# Patient Record
Sex: Female | Born: 1940
Health system: Southern US, Community
[De-identification: ages and names within clinical notes are randomized; demographics above are authoritative.]

## PROBLEM LIST (undated history)

## (undated) DIAGNOSIS — E1142 Type 2 diabetes mellitus with diabetic polyneuropathy: Secondary | ICD-10-CM

## (undated) DIAGNOSIS — M179 Osteoarthritis of knee, unspecified: Secondary | ICD-10-CM

## (undated) DIAGNOSIS — E7800 Pure hypercholesterolemia, unspecified: Secondary | ICD-10-CM

## (undated) DIAGNOSIS — E039 Hypothyroidism, unspecified: Secondary | ICD-10-CM

## (undated) DIAGNOSIS — I10 Essential (primary) hypertension: Secondary | ICD-10-CM

## (undated) DIAGNOSIS — R32 Unspecified urinary incontinence: Secondary | ICD-10-CM

## (undated) DIAGNOSIS — G2581 Restless legs syndrome: Secondary | ICD-10-CM

## (undated) DIAGNOSIS — M1990 Unspecified osteoarthritis, unspecified site: Secondary | ICD-10-CM

## (undated) DIAGNOSIS — G709 Myoneural disorder, unspecified: Secondary | ICD-10-CM

## (undated) DIAGNOSIS — Z9289 Personal history of other medical treatment: Secondary | ICD-10-CM

## (undated) HISTORY — DX: Essential (primary) hypertension: I10

## (undated) HISTORY — PX: APPENDECTOMY: SHX54

## (undated) HISTORY — DX: Unspecified urinary incontinence: R32

## (undated) HISTORY — DX: Pure hypercholesterolemia, unspecified: E78.00

## (undated) HISTORY — PX: OTHER SURGICAL HISTORY: SHX169

## (undated) HISTORY — DX: Restless legs syndrome: G25.81

## (undated) HISTORY — PX: CATARACT EXTRACTION, BILATERAL: SHX1313

## (undated) HISTORY — PX: EYE SURGERY: SHX253

## (undated) HISTORY — DX: Osteoarthritis of knee, unspecified: M17.9

## (undated) HISTORY — PX: KNEE CARTILAGE SURGERY: SHX688

## (undated) HISTORY — PX: TONSILLECTOMY: SUR1361

## (undated) HISTORY — DX: Type 2 diabetes mellitus with diabetic polyneuropathy: E11.42

## (undated) HISTORY — PX: ABDOMINAL HYSTERECTOMY: SHX81

## (undated) HISTORY — DX: Unspecified osteoarthritis, unspecified site: M19.90

---

## 1998-10-27 ENCOUNTER — Encounter: Admission: RE | Admit: 1998-10-27 | Discharge: 1999-01-25 | Payer: Self-pay | Admitting: Internal Medicine

## 1999-07-14 ENCOUNTER — Encounter: Admission: RE | Admit: 1999-07-14 | Discharge: 1999-07-14 | Payer: Self-pay | Admitting: Internal Medicine

## 1999-07-14 ENCOUNTER — Encounter: Payer: Self-pay | Admitting: Internal Medicine

## 1999-07-27 ENCOUNTER — Encounter: Admission: RE | Admit: 1999-07-27 | Discharge: 1999-07-27 | Payer: Self-pay | Admitting: Internal Medicine

## 1999-07-27 ENCOUNTER — Encounter: Payer: Self-pay | Admitting: Internal Medicine

## 1999-10-25 ENCOUNTER — Other Ambulatory Visit: Admission: RE | Admit: 1999-10-25 | Discharge: 1999-10-25 | Payer: Self-pay | Admitting: Internal Medicine

## 2000-01-28 ENCOUNTER — Encounter: Admission: RE | Admit: 2000-01-28 | Discharge: 2000-01-28 | Payer: Self-pay | Admitting: Internal Medicine

## 2000-01-28 ENCOUNTER — Encounter: Payer: Self-pay | Admitting: Internal Medicine

## 2000-08-16 ENCOUNTER — Encounter: Payer: Self-pay | Admitting: Internal Medicine

## 2000-08-16 ENCOUNTER — Encounter: Admission: RE | Admit: 2000-08-16 | Discharge: 2000-08-16 | Payer: Self-pay | Admitting: Internal Medicine

## 2000-12-14 ENCOUNTER — Other Ambulatory Visit: Admission: RE | Admit: 2000-12-14 | Discharge: 2000-12-14 | Payer: Self-pay | Admitting: Internal Medicine

## 2001-08-23 ENCOUNTER — Encounter: Payer: Self-pay | Admitting: Internal Medicine

## 2001-08-23 ENCOUNTER — Encounter: Admission: RE | Admit: 2001-08-23 | Discharge: 2001-08-23 | Payer: Self-pay | Admitting: Internal Medicine

## 2001-12-31 ENCOUNTER — Other Ambulatory Visit: Admission: RE | Admit: 2001-12-31 | Discharge: 2001-12-31 | Payer: Self-pay | Admitting: Internal Medicine

## 2002-09-24 ENCOUNTER — Encounter: Admission: RE | Admit: 2002-09-24 | Discharge: 2002-09-24 | Payer: Self-pay | Admitting: Internal Medicine

## 2002-09-24 ENCOUNTER — Encounter: Payer: Self-pay | Admitting: Internal Medicine

## 2003-09-30 ENCOUNTER — Encounter: Admission: RE | Admit: 2003-09-30 | Discharge: 2003-09-30 | Payer: Self-pay | Admitting: Internal Medicine

## 2004-10-06 ENCOUNTER — Encounter: Admission: RE | Admit: 2004-10-06 | Discharge: 2004-10-06 | Payer: Self-pay | Admitting: Internal Medicine

## 2005-03-08 ENCOUNTER — Other Ambulatory Visit: Admission: RE | Admit: 2005-03-08 | Discharge: 2005-03-08 | Payer: Self-pay | Admitting: Internal Medicine

## 2005-11-09 ENCOUNTER — Encounter: Admission: RE | Admit: 2005-11-09 | Discharge: 2005-11-09 | Payer: Self-pay | Admitting: Internal Medicine

## 2006-12-20 ENCOUNTER — Encounter: Admission: RE | Admit: 2006-12-20 | Discharge: 2006-12-20 | Payer: Self-pay | Admitting: Internal Medicine

## 2007-04-12 ENCOUNTER — Other Ambulatory Visit: Admission: RE | Admit: 2007-04-12 | Discharge: 2007-04-12 | Payer: Self-pay | Admitting: Internal Medicine

## 2008-01-03 ENCOUNTER — Encounter: Admission: RE | Admit: 2008-01-03 | Discharge: 2008-01-03 | Payer: Self-pay | Admitting: Family Medicine

## 2009-01-07 ENCOUNTER — Encounter: Admission: RE | Admit: 2009-01-07 | Discharge: 2009-01-07 | Payer: Self-pay | Admitting: Family Medicine

## 2010-01-12 ENCOUNTER — Encounter: Admission: RE | Admit: 2010-01-12 | Discharge: 2010-01-12 | Payer: Self-pay | Admitting: Family Medicine

## 2010-01-21 ENCOUNTER — Encounter: Admission: RE | Admit: 2010-01-21 | Discharge: 2010-01-21 | Payer: Self-pay | Admitting: Family Medicine

## 2010-06-27 ENCOUNTER — Encounter: Payer: Self-pay | Admitting: Family Medicine

## 2011-01-14 ENCOUNTER — Other Ambulatory Visit: Payer: Self-pay | Admitting: Family Medicine

## 2011-01-14 DIAGNOSIS — Z1231 Encounter for screening mammogram for malignant neoplasm of breast: Secondary | ICD-10-CM

## 2011-02-09 ENCOUNTER — Ambulatory Visit: Payer: Self-pay

## 2011-02-15 ENCOUNTER — Ambulatory Visit
Admission: RE | Admit: 2011-02-15 | Discharge: 2011-02-15 | Disposition: A | Discharge status: Home / Self Care | Payer: Medicare Other | Source: Ambulatory Visit | Attending: Family Medicine | Admitting: Family Medicine

## 2011-02-15 DIAGNOSIS — Z1231 Encounter for screening mammogram for malignant neoplasm of breast: Secondary | ICD-10-CM

## 2011-09-22 ENCOUNTER — Other Ambulatory Visit (HOSPITAL_COMMUNITY): Payer: Self-pay | Admitting: Otolaryngology

## 2011-09-22 DIAGNOSIS — J3489 Other specified disorders of nose and nasal sinuses: Secondary | ICD-10-CM

## 2011-09-27 ENCOUNTER — Ambulatory Visit (HOSPITAL_COMMUNITY)
Admission: RE | Admit: 2011-09-27 | Discharge: 2011-09-27 | Disposition: A | Discharge status: Home / Self Care | Payer: Medicare Other | Source: Ambulatory Visit | Attending: Otolaryngology | Admitting: Otolaryngology

## 2011-09-27 DIAGNOSIS — R221 Localized swelling, mass and lump, neck: Secondary | ICD-10-CM | POA: Insufficient documentation

## 2011-09-27 DIAGNOSIS — J3489 Other specified disorders of nose and nasal sinuses: Secondary | ICD-10-CM

## 2011-09-27 DIAGNOSIS — R220 Localized swelling, mass and lump, head: Secondary | ICD-10-CM | POA: Insufficient documentation

## 2011-12-14 ENCOUNTER — Other Ambulatory Visit (HOSPITAL_COMMUNITY): Payer: Self-pay | Admitting: Otolaryngology

## 2012-01-16 ENCOUNTER — Encounter (HOSPITAL_COMMUNITY): Payer: Self-pay | Admitting: Pharmacy Technician

## 2012-01-27 ENCOUNTER — Encounter (HOSPITAL_COMMUNITY): Payer: Self-pay | Admitting: Pharmacy Technician

## 2012-01-27 ENCOUNTER — Encounter (HOSPITAL_COMMUNITY)
Admission: RE | Admit: 2012-01-27 | Discharge: 2012-01-27 | Disposition: A | Discharge status: Home / Self Care | Payer: Medicare Other | Source: Ambulatory Visit | Attending: Otolaryngology | Admitting: Otolaryngology

## 2012-01-27 ENCOUNTER — Encounter (HOSPITAL_COMMUNITY): Payer: Self-pay

## 2012-01-27 HISTORY — DX: Hypothyroidism, unspecified: E03.9

## 2012-01-27 HISTORY — DX: Myoneural disorder, unspecified: G70.9

## 2012-01-27 HISTORY — DX: Personal history of other medical treatment: Z92.89

## 2012-01-27 HISTORY — DX: Unspecified osteoarthritis, unspecified site: M19.90

## 2012-01-27 HISTORY — DX: Essential (primary) hypertension: I10

## 2012-01-27 LAB — CBC
HCT: 45.8 % (ref 36.0–46.0)
Hemoglobin: 15.7 g/dL — ABNORMAL HIGH (ref 12.0–15.0)
MCH: 32.3 pg (ref 26.0–34.0)
MCHC: 34.3 g/dL (ref 30.0–36.0)
MCV: 94.2 fL (ref 78.0–100.0)
Platelets: 254 10*3/uL (ref 150–400)
RBC: 4.86 MIL/uL (ref 3.87–5.11)
RDW: 11.9 % (ref 11.5–15.5)
WBC: 8.1 10*3/uL (ref 4.0–10.5)

## 2012-01-27 LAB — BASIC METABOLIC PANEL
BUN: 19 mg/dL (ref 6–23)
CO2: 33 mEq/L — ABNORMAL HIGH (ref 19–32)
Calcium: 10.9 mg/dL — ABNORMAL HIGH (ref 8.4–10.5)
Chloride: 99 mEq/L (ref 96–112)
Creatinine, Ser: 0.7 mg/dL (ref 0.50–1.10)
GFR calc Af Amer: >90 mL/min (ref >90)
GFR calc non Af Amer: 86 mL/min — ABNORMAL LOW (ref >90)
Glucose, Bld: 151 mg/dL — ABNORMAL HIGH (ref 70–99)
Potassium: 3.9 mEq/L (ref 3.5–5.1)
Sodium: 142 mEq/L (ref 135–145)

## 2012-01-27 LAB — SURGICAL PCR SCREEN
MRSA, PCR: NEGATIVE
Staphylococcus aureus: NEGATIVE

## 2012-01-27 NOTE — Progress Notes (Signed)
Left voicemail at MR at [facility] location-ph: [phone], requesting last ekg.

## 2012-01-27 NOTE — Pre-Procedure Instructions (Signed)
20 Brittney Malone  01/27/2012   Your procedure is scheduled on:  August 29th  Report to [facility] at 5:30 AM.  Call this number if you have problems the morning of surgery: [phone]   Remember:   Do not eat food or drink :After Midnight.       Take these medicines the morning of surgery with A SIP OF WATER: None  Do not wear jewelry, make-up or nail polish.  Do not wear lotions, powders, or perfumes. You may wear deodorant.  Do not shave 48 hours prior to surgery. Men may shave face and neck.  Do not bring valuables to the hospital.  Contacts, dentures or bridgework may not be worn into surgery.  Leave suitcase in the car. After surgery it may be brought to your room.  For patients admitted to the hospital, checkout time is 11:00 AM the day of discharge.   Patients discharged the day of surgery will not be allowed to drive home.    Special Instructions: CHG Shower Use Special Wash: 1/2 bottle night before surgery and 1/2 bottle morning of surgery.   Please read over the following fact sheets that you were given: Pain Booklet, Coughing and Deep Breathing, MRSA Information and Surgical Site Infection Prevention

## 2012-01-27 NOTE — Progress Notes (Signed)
Pt. Reports that she had her last EKG in PCP office, Dr. Dahlia Bailiff at [place] location .

## 2012-01-31 NOTE — Consult Note (Signed)
Anesthesia chart review: Patient is a 71 year old female scheduled for endoscopic resection of left anterior cranial fossa tumor, left maxillary antrostomy, left ethmoidectomy, left sphenoidectomy, left frontal sinusotomy for hemangiopericytoma by Dr. Emeline Darling on 02/02/12.  History includes non-smoker, obesity, HTN, hypothyroidism, DM2, arthritis.  PCP is Dr. Tiburcio Pea ([facility]).    Labs noted.  Cr 0.70, glucose 151.  H/H 15.7/45.8.    CXR on 01/27/12 showed minimal bronchitic changes.  EKG on 01/27/12 showed SR with PVC/PAC, poor r wave progression (cannot exclude prior anterior MI).  She had a previous EKG at [facility] on 02/17/09.  Low r wave in V3 and single PVC/PAC are new, otherwise stable.  She reportedly had a normal stress > 3 years ago (no copy found at [facility]). No CV symptoms were documented at her PAT appointment.  Clinical correlation on the day of surgery.  If asymptomatic, then anticipate she can proceed as planned.  Shonna Chock, PA-C

## 2012-02-02 ENCOUNTER — Encounter (HOSPITAL_COMMUNITY)
Admission: RE | Disposition: A | Discharge status: Home / Self Care | Payer: Self-pay | Source: Ambulatory Visit | Attending: Otolaryngology

## 2012-02-02 ENCOUNTER — Encounter (HOSPITAL_COMMUNITY): Payer: Self-pay | Admitting: *Deleted

## 2012-02-02 ENCOUNTER — Encounter (HOSPITAL_COMMUNITY): Payer: Self-pay | Admitting: Vascular Surgery

## 2012-02-02 ENCOUNTER — Observation Stay (HOSPITAL_COMMUNITY)
Admission: RE | Admit: 2012-02-02 | Discharge: 2012-02-03 | Disposition: A | Discharge status: Home / Self Care | Payer: Medicare Other | Source: Ambulatory Visit | Attending: Otolaryngology | Admitting: Otolaryngology

## 2012-02-02 ENCOUNTER — Ambulatory Visit (HOSPITAL_COMMUNITY): Payer: Medicare Other | Admitting: Vascular Surgery

## 2012-02-02 DIAGNOSIS — E119 Type 2 diabetes mellitus without complications: Secondary | ICD-10-CM | POA: Insufficient documentation

## 2012-02-02 DIAGNOSIS — I10 Essential (primary) hypertension: Secondary | ICD-10-CM | POA: Insufficient documentation

## 2012-02-02 DIAGNOSIS — Z01810 Encounter for preprocedural cardiovascular examination: Secondary | ICD-10-CM | POA: Insufficient documentation

## 2012-02-02 DIAGNOSIS — C311 Malignant neoplasm of ethmoidal sinus: Principal | ICD-10-CM | POA: Insufficient documentation

## 2012-02-02 DIAGNOSIS — E039 Hypothyroidism, unspecified: Secondary | ICD-10-CM | POA: Insufficient documentation

## 2012-02-02 DIAGNOSIS — Z01812 Encounter for preprocedural laboratory examination: Secondary | ICD-10-CM | POA: Insufficient documentation

## 2012-02-02 DIAGNOSIS — G988 Other disorders of nervous system: Secondary | ICD-10-CM | POA: Insufficient documentation

## 2012-02-02 DIAGNOSIS — Z01818 Encounter for other preprocedural examination: Secondary | ICD-10-CM | POA: Insufficient documentation

## 2012-02-02 DIAGNOSIS — Y838 Other surgical procedures as the cause of abnormal reaction of the patient, or of later complication, without mention of misadventure at the time of the procedure: Secondary | ICD-10-CM | POA: Insufficient documentation

## 2012-02-02 HISTORY — PX: NASAL SINUS SURGERY: SHX719

## 2012-02-02 HISTORY — PX: ETHMOIDECTOMY: SHX5197

## 2012-02-02 HISTORY — PX: MAXILLARY ANTROSTOMY: SHX2003

## 2012-02-02 HISTORY — PX: SPHENOIDECTOMY: SHX2421

## 2012-02-02 LAB — GLUCOSE, CAPILLARY
Glucose-Capillary: 139 mg/dL — ABNORMAL HIGH (ref 70–99)
Glucose-Capillary: 158 mg/dL — ABNORMAL HIGH (ref 70–99)
Glucose-Capillary: 183 mg/dL — ABNORMAL HIGH (ref 70–99)
Glucose-Capillary: 178 mg/dL — ABNORMAL HIGH (ref 70–99)

## 2012-02-02 SURGERY — MAXILLARY ANTROSTOMY
Anesthesia: General | Site: Nose | Laterality: Left | Wound class: Clean Contaminated

## 2012-02-02 MED ORDER — HYDROMORPHONE HCL PF 1 MG/ML IJ SOLN
0.2500 mg | INTRAMUSCULAR | Status: DC | PRN
  Administered 2012-02-02: 0.5 mg via INTRAVENOUS

## 2012-02-02 MED ORDER — OXYCODONE HCL 5 MG PO TABS
5.0000 mg | ORAL_TABLET | Freq: Once | ORAL | Status: DC | PRN
Start: 2012-02-02 — End: 2012-02-02

## 2012-02-02 MED ORDER — PROPOFOL 10 MG/ML IV EMUL
INTRAVENOUS | Status: DC | PRN
  Administered 2012-02-02: 150 mg via INTRAVENOUS
  Administered 2012-02-02: 50 mg via INTRAVENOUS

## 2012-02-02 MED ORDER — HYDROMORPHONE HCL PF 1 MG/ML IJ SOLN
INTRAMUSCULAR | Status: AC
  Filled 2012-02-02: qty 1

## 2012-02-02 MED ORDER — HEMOSTATIC AGENTS (NO CHARGE) OPTIME
TOPICAL | Status: DC | PRN
  Administered 2012-02-02: 1 via TOPICAL

## 2012-02-02 MED ORDER — ADULT MULTIVITAMIN W/MINERALS CH
1.0000 | ORAL_TABLET | Freq: Every day | ORAL | Status: DC
Start: 2012-02-02 — End: 2012-02-03
  Administered 2012-02-02 – 2012-02-03 (×2): 1 via ORAL
  Filled 2012-02-02 (×2): qty 1

## 2012-02-02 MED ORDER — INSULIN ASPART 100 UNIT/ML ~~LOC~~ SOLN: 0.0000 [IU] | Freq: Every day | SUBCUTANEOUS | Status: DC

## 2012-02-02 MED ORDER — SODIUM CHLORIDE 0.9 % IR SOLN
Status: DC | PRN
  Administered 2012-02-02: 1000 mL

## 2012-02-02 MED ORDER — HYDROCODONE-ACETAMINOPHEN 5-325 MG PO TABS
1.0000 | ORAL_TABLET | ORAL | Status: DC | PRN
  Administered 2012-02-02: 1 via ORAL
  Administered 2012-02-03 (×2): 2 via ORAL
  Filled 2012-02-02 (×3): qty 2

## 2012-02-02 MED ORDER — ONDANSETRON HCL 4 MG/2ML IJ SOLN: 4.0000 mg | Freq: Four times a day (QID) | INTRAMUSCULAR | Status: DC | PRN

## 2012-02-02 MED ORDER — GLIPIZIDE-METFORMIN HCL 5-500 MG PO TABS: 2.0000 | ORAL_TABLET | Freq: Two times a day (BID) | ORAL | Status: DC

## 2012-02-02 MED ORDER — MIDAZOLAM HCL 5 MG/5ML IJ SOLN
INTRAMUSCULAR | Status: DC | PRN
  Administered 2012-02-02: 2 mg via INTRAVENOUS

## 2012-02-02 MED ORDER — DICLOFENAC SODIUM 1.5 % TD SOLN: 40.0000 [drp] | Freq: Two times a day (BID) | TRANSDERMAL | Status: DC

## 2012-02-02 MED ORDER — ARTIFICIAL TEARS OP OINT
TOPICAL_OINTMENT | OPHTHALMIC | Status: DC | PRN
  Administered 2012-02-02: 1 via OPHTHALMIC

## 2012-02-02 MED ORDER — PHENOL 1.4 % MT LIQD
2.0000 | Freq: Three times a day (TID) | OROMUCOSAL | Status: DC | PRN
  Filled 2012-02-02: qty 177

## 2012-02-02 MED ORDER — PHENYLEPHRINE HCL 10 MG/ML IJ SOLN
10.0000 mg | INTRAVENOUS | Status: DC | PRN
  Administered 2012-02-02: 20 ug/min via INTRAVENOUS

## 2012-02-02 MED ORDER — PHENYLEPHRINE HCL 10 MG/ML IJ SOLN
INTRAMUSCULAR | Status: DC | PRN
  Administered 2012-02-02 (×5): 80 ug via INTRAVENOUS

## 2012-02-02 MED ORDER — HYDROCHLOROTHIAZIDE 25 MG PO TABS
25.0000 mg | ORAL_TABLET | Freq: Every day | ORAL | Status: DC
  Administered 2012-02-03: 25 mg via ORAL
  Filled 2012-02-02 (×2): qty 1

## 2012-02-02 MED ORDER — DROPERIDOL 2.5 MG/ML IJ SOLN: 0.6250 mg | INTRAMUSCULAR | Status: DC | PRN

## 2012-02-02 MED ORDER — LEVOTHYROXINE SODIUM 150 MCG PO TABS
150.0000 ug | ORAL_TABLET | Freq: Every day | ORAL | Status: DC
  Administered 2012-02-03: 150 ug via ORAL
  Filled 2012-02-02 (×2): qty 1

## 2012-02-02 MED ORDER — CEFAZOLIN SODIUM-DEXTROSE 2-3 GM-% IV SOLR
2.0000 g | Freq: Once | INTRAVENOUS | Status: AC
  Administered 2012-02-02: 2 g via INTRAVENOUS
  Filled 2012-02-02: qty 50

## 2012-02-02 MED ORDER — ACETAMINOPHEN 80 MG PO CHEW
320.0000 mg | CHEWABLE_TABLET | ORAL | Status: DC | PRN
  Filled 2012-02-02: qty 4

## 2012-02-02 MED ORDER — METFORMIN HCL 500 MG PO TABS
1000.0000 mg | ORAL_TABLET | Freq: Two times a day (BID) | ORAL | Status: DC
  Administered 2012-02-02 – 2012-02-03 (×2): 1000 mg via ORAL
  Filled 2012-02-02 (×4): qty 2

## 2012-02-02 MED ORDER — CALCIUM CARBONATE-VITAMIN D 600-400 MG-UNIT PO CHEW: 1.0000 | CHEWABLE_TABLET | Freq: Every day | ORAL | Status: DC

## 2012-02-02 MED ORDER — CALCIUM CARBONATE-VITAMIN D 500-200 MG-UNIT PO TABS
1.0000 | ORAL_TABLET | Freq: Every day | ORAL | Status: DC
  Administered 2012-02-03: 1 via ORAL
  Filled 2012-02-02 (×2): qty 1

## 2012-02-02 MED ORDER — BACITRACIN-NEOMYCIN-POLYMYXIN 400-5-5000 EX OINT
TOPICAL_OINTMENT | CUTANEOUS | Status: AC
  Filled 2012-02-02: qty 1

## 2012-02-02 MED ORDER — CEFAZOLIN SODIUM 1-5 GM-% IV SOLN
1.0000 g | Freq: Three times a day (TID) | INTRAVENOUS | Status: DC
  Administered 2012-02-02 – 2012-02-03 (×3): 1 g via INTRAVENOUS
  Filled 2012-02-02 (×5): qty 50

## 2012-02-02 MED ORDER — FENTANYL CITRATE 0.05 MG/ML IJ SOLN
INTRAMUSCULAR | Status: DC | PRN
  Administered 2012-02-02 (×2): 50 ug via INTRAVENOUS
  Administered 2012-02-02: 150 ug via INTRAVENOUS

## 2012-02-02 MED ORDER — OMEGA-3-ACID ETHYL ESTERS 1 G PO CAPS
1.0000 g | ORAL_CAPSULE | Freq: Every day | ORAL | Status: DC
  Administered 2012-02-03: 1 g via ORAL
  Filled 2012-02-02 (×2): qty 1

## 2012-02-02 MED ORDER — ONDANSETRON HCL 4 MG PO TABS: 4.0000 mg | ORAL_TABLET | Freq: Four times a day (QID) | ORAL | Status: DC | PRN

## 2012-02-02 MED ORDER — POTASSIUM CHLORIDE ER 10 MEQ PO TBCR
20.0000 meq | EXTENDED_RELEASE_TABLET | Freq: Every day | ORAL | Status: DC
  Administered 2012-02-03: 20 meq via ORAL
  Filled 2012-02-02 (×2): qty 2

## 2012-02-02 MED ORDER — MUPIROCIN CALCIUM 2 % NA OINT
TOPICAL_OINTMENT | NASAL | Status: DC | PRN
  Administered 2012-02-02: 1 via NASAL

## 2012-02-02 MED ORDER — MUPIROCIN CALCIUM 2 % EX CREA
TOPICAL_CREAM | CUTANEOUS | Status: AC
  Filled 2012-02-02: qty 15

## 2012-02-02 MED ORDER — MORPHINE SULFATE 2 MG/ML IJ SOLN
1.0000 mg | INTRAMUSCULAR | Status: DC | PRN
  Administered 2012-02-02: 14:00:00 via INTRAVENOUS
  Administered 2012-02-03: 1 mg via INTRAVENOUS
  Filled 2012-02-02 (×2): qty 1

## 2012-02-02 MED ORDER — LIDOCAINE HCL 4 % MT SOLN
OROMUCOSAL | Status: DC | PRN
  Administered 2012-02-02: 4 mL via TOPICAL

## 2012-02-02 MED ORDER — OXYCODONE HCL 5 MG/5ML PO SOLN: 5.0000 mg | Freq: Once | ORAL | Status: DC | PRN

## 2012-02-02 MED ORDER — LIDOCAINE-EPINEPHRINE 1 %-1:100000 IJ SOLN
INTRAMUSCULAR | Status: AC
  Filled 2012-02-02: qty 1

## 2012-02-02 MED ORDER — CEFAZOLIN SODIUM-DEXTROSE 2-3 GM-% IV SOLR
INTRAVENOUS | Status: AC
  Filled 2012-02-02: qty 50

## 2012-02-02 MED ORDER — NAPROXEN SODIUM 220 MG PO TABS: 220.0000 mg | ORAL_TABLET | Freq: Every day | ORAL | Status: DC

## 2012-02-02 MED ORDER — 0.9 % SODIUM CHLORIDE (POUR BTL) OPTIME
TOPICAL | Status: DC | PRN
  Administered 2012-02-02: 1000 mL

## 2012-02-02 MED ORDER — OXYMETAZOLINE HCL 0.05 % NA SOLN
NASAL | Status: DC | PRN
  Administered 2012-02-02: 1

## 2012-02-02 MED ORDER — LIDOCAINE-EPINEPHRINE 1 %-1:100000 IJ SOLN
INTRAMUSCULAR | Status: DC | PRN
  Administered 2012-02-02: 6 mL

## 2012-02-02 MED ORDER — VITAMIN D3 25 MCG (1000 UNIT) PO TABS
1000.0000 [IU] | ORAL_TABLET | Freq: Every day | ORAL | Status: DC
  Administered 2012-02-02 – 2012-02-03 (×2): 1000 [IU] via ORAL
  Filled 2012-02-02 (×2): qty 1

## 2012-02-02 MED ORDER — LIDOCAINE HCL 1 % IJ SOLN
INTRAMUSCULAR | Status: DC | PRN
  Administered 2012-02-02: 90 mg via INTRADERMAL

## 2012-02-02 MED ORDER — GLIPIZIDE 10 MG PO TABS
10.0000 mg | ORAL_TABLET | Freq: Two times a day (BID) | ORAL | Status: DC
  Administered 2012-02-02 – 2012-02-03 (×2): 10 mg via ORAL
  Filled 2012-02-02 (×4): qty 1

## 2012-02-02 MED ORDER — LACTATED RINGERS IV SOLN
INTRAVENOUS | Status: DC | PRN
  Administered 2012-02-02 (×2): via INTRAVENOUS

## 2012-02-02 MED ORDER — ONDANSETRON HCL 4 MG/2ML IJ SOLN
INTRAMUSCULAR | Status: DC | PRN
  Administered 2012-02-02: 4 mg via INTRAVENOUS

## 2012-02-02 MED ORDER — HYDRALAZINE HCL 20 MG/ML IJ SOLN
10.0000 mg | Freq: Three times a day (TID) | INTRAMUSCULAR | Status: DC | PRN
  Filled 2012-02-02: qty 0.5

## 2012-02-02 MED ORDER — SUCCINYLCHOLINE CHLORIDE 20 MG/ML IJ SOLN
INTRAMUSCULAR | Status: DC | PRN
  Administered 2012-02-02: 120 mg via INTRAVENOUS

## 2012-02-02 MED ORDER — OXYMETAZOLINE HCL 0.05 % NA SOLN
NASAL | Status: AC
  Filled 2012-02-02: qty 15

## 2012-02-02 MED ORDER — GLUCOSAMINE-CHONDROITIN 250-200 MG PO TABS: 1.0000 | ORAL_TABLET | Freq: Two times a day (BID) | ORAL | Status: DC

## 2012-02-02 MED ORDER — NAPROXEN 250 MG PO TABS
250.0000 mg | ORAL_TABLET | Freq: Every day | ORAL | Status: DC
  Administered 2012-02-03: 250 mg via ORAL
  Filled 2012-02-02: qty 1

## 2012-02-02 MED ORDER — KCL IN DEXTROSE-NACL 10-5-0.45 MEQ/L-%-% IV SOLN
INTRAVENOUS | Status: DC
  Administered 2012-02-02: 18:00:00 via INTRAVENOUS
  Filled 2012-02-02 (×3): qty 1000

## 2012-02-02 MED ORDER — INSULIN ASPART 100 UNIT/ML ~~LOC~~ SOLN
0.0000 [IU] | Freq: Three times a day (TID) | SUBCUTANEOUS | Status: DC
  Administered 2012-02-03: 3 [IU] via SUBCUTANEOUS

## 2012-02-02 SURGICAL SUPPLY — 64 items
APL SKNCLS STERI-STRIP NONHPOA (GAUZE/BANDAGES/DRESSINGS) ×1
ATTRACTOMAT 16X20 MAGNETIC DRP (DRAPES) IMPLANT
BENZOIN TINCTURE PRP APPL 2/3 (GAUZE/BANDAGES/DRESSINGS) ×1 IMPLANT
BLADE RAD40 ROTATE 4M 4 5PK (BLADE) IMPLANT
BLADE RAD60 ROTATE M4 4 5PK (BLADE) ×1 IMPLANT
BLADE TRICUT ROTATE M4 4 5PK (BLADE) ×2 IMPLANT
CANISTER SUCTION 2500CC (MISCELLANEOUS) ×3 IMPLANT
CLOTH BEACON ORANGE TIMEOUT ST (SAFETY) ×2 IMPLANT
COAGULATOR SUCT SWTCH 10FR 6 (ELECTROSURGICAL) ×2 IMPLANT
CONT SPEC 4OZ CLIKSEAL STRL BL (MISCELLANEOUS) ×6 IMPLANT
CORDS BIPOLAR (ELECTRODE) ×1 IMPLANT
CRADLE DONUT ADULT HEAD (MISCELLANEOUS) ×1 IMPLANT
DECANTER SPIKE VIAL GLASS SM (MISCELLANEOUS) ×2 IMPLANT
DRESSING NASAL POPE 10X1.5X2.5 (GAUZE/BANDAGES/DRESSINGS) IMPLANT
DRESSING TELFA 8X3 (GAUZE/BANDAGES/DRESSINGS) IMPLANT
DRSG NASAL POPE 10X1.5X2.5 (GAUZE/BANDAGES/DRESSINGS) ×2
DRSG NASOPORE 8CM (GAUZE/BANDAGES/DRESSINGS) IMPLANT
DURASEAL SPINE SEALANT 3ML (MISCELLANEOUS) ×1 IMPLANT
ELECT REM PT RETURN 9FT ADLT (ELECTROSURGICAL) ×2
ELECTRODE REM PT RTRN 9FT ADLT (ELECTROSURGICAL) IMPLANT
FILTER ARTHROSCOPY CONVERTOR (FILTER) ×2 IMPLANT
FLOSEAL 10ML (HEMOSTASIS) IMPLANT
GLOVE BIOGEL PI IND STRL 6.5 (GLOVE) IMPLANT
GLOVE BIOGEL PI IND STRL 7.5 (GLOVE) IMPLANT
GLOVE BIOGEL PI INDICATOR 6.5 (GLOVE) ×2
GLOVE BIOGEL PI INDICATOR 7.5 (GLOVE) ×1
GLOVE ECLIPSE 7.0 STRL STRAW (GLOVE) ×1 IMPLANT
GLOVE SURG SS PI 6.5 STRL IVOR (GLOVE) ×2 IMPLANT
GLOVE SURG SS PI 7.5 STRL IVOR (GLOVE) ×2 IMPLANT
GOWN STRL NON-REIN LRG LVL3 (GOWN DISPOSABLE) ×5 IMPLANT
HEMOSTAT SURGICEL 2X14 (HEMOSTASIS) ×1 IMPLANT
KIT BASIN OR (CUSTOM PROCEDURE TRAY) ×2 IMPLANT
KIT ROOM TURNOVER OR (KITS) ×2 IMPLANT
MARKER SPHERE PSV REFLC NDI (MISCELLANEOUS) ×4 IMPLANT
NDL SPNL 25GX3.5 QUINCKE BL (NEEDLE) ×1 IMPLANT
NEEDLE 27GAX1X1/2 (NEEDLE) IMPLANT
NEEDLE SPNL 25GX3.5 QUINCKE BL (NEEDLE) ×2 IMPLANT
NS IRRIG 1000ML POUR BTL (IV SOLUTION) ×2 IMPLANT
PAD ARMBOARD 7.5X6 YLW CONV (MISCELLANEOUS) ×4 IMPLANT
PAD ENT ADHESIVE 25PK (MISCELLANEOUS) ×1 IMPLANT
PATTIES SURGICAL .5 X3 (DISPOSABLE) ×2 IMPLANT
PENCIL FOOT CONTROL (ELECTRODE) ×2 IMPLANT
SHEATH ENDOSCRUB 0 DEG (SHEATH) ×1 IMPLANT
SHEATH ENDOSCRUB 30 DEG (SHEATH) IMPLANT
SHEATH ENDOSCRUB 45 DEG (SHEATH) IMPLANT
SOLUTION ANTI FOG 6CC (MISCELLANEOUS) ×2 IMPLANT
SPECIMEN JAR SMALL (MISCELLANEOUS) ×6 IMPLANT
STRIP CLOSURE SKIN 1/2X4 (GAUZE/BANDAGES/DRESSINGS) ×2 IMPLANT
SUT ETHILON 3 0 PS 1 (SUTURE) IMPLANT
SUT SILK 2 0 FS (SUTURE) ×2 IMPLANT
SWAB COLLECTION DEVICE MRSA (MISCELLANEOUS) IMPLANT
SYR 50ML SLIP (SYRINGE) IMPLANT
SYR BULB 3OZ (MISCELLANEOUS) ×1 IMPLANT
SYRINGE 10CC LL (SYRINGE) ×3 IMPLANT
TOWEL OR 17X24 6PK STRL BLUE (TOWEL DISPOSABLE) ×2 IMPLANT
TOWEL OR 17X26 10 PK STRL BLUE (TOWEL DISPOSABLE) ×2 IMPLANT
TRACKER ENT INSTRUMENT (MISCELLANEOUS) ×1 IMPLANT
TRACKER ENT PATIENT (MISCELLANEOUS) ×1 IMPLANT
TRAY ENT MC OR (CUSTOM PROCEDURE TRAY) ×2 IMPLANT
TRAY FOLEY CATH 14FR (SET/KITS/TRAYS/PACK) ×1 IMPLANT
TUBE CONNECTING 12X1/4 (SUCTIONS) ×1 IMPLANT
TUBING STRAIGHTSHOT EPS 5PK (TUBING) ×1 IMPLANT
WATER STERILE IRR 1000ML POUR (IV SOLUTION) ×2 IMPLANT
WIPE INSTRUMENT VISIWIPE 73X73 (MISCELLANEOUS) ×2 IMPLANT

## 2012-02-02 NOTE — Preoperative (Signed)
Beta Blockers   Reason not to administer Beta Blockers:Not Applicable 

## 2012-02-02 NOTE — Progress Notes (Signed)
PHARMACIST - PHYSICIAN ORDER COMMUNICATION  CONCERNING: P&T Medication Policy on Herbal Medications  DESCRIPTION:  This patient's order for:  Glucosamine-chondroitin  has been noted.  This product(s) is classified as an "herbal" or natural product. Due to a lack of definitive safety studies or FDA approval, nonstandard manufacturing practices, plus the potential risk of unknown drug-drug interactions while on inpatient medications, the Pharmacy and Therapeutics Committee does not permit the use of "herbal" or natural products of this type within [facility].   ACTION TAKEN: The pharmacy department is unable to verify this order at this time and your patient has been informed of this safety policy. Please reevaluate patient's clinical condition at discharge and address if the herbal or natural product(s) should be resumed at that time.    Thank you, Nicolasa Ducking, PharmD Clinical Pharmacist Pgr [phone]

## 2012-02-02 NOTE — Anesthesia Postprocedure Evaluation (Signed)
Anesthesia Post Note  Patient: Brittney Malone  Procedure(s) Performed: Procedure(s) (LRB): MAXILLARY ANTROSTOMY (Left) SPHENOIDECTOMY (Left) ENDOSCOPIC SINUS SURGERY WITH STEALTH (Left) ETHMOIDECTOMY (Left)  Anesthesia type: general  Patient location: PACU  Post pain: Pain level controlled  Post assessment: Patient's Cardiovascular Status Stable  Last Vitals:  Filed Vitals:   02/02/12 1200  BP: 138/55  Pulse: 106  Temp:   Resp: 17    Post vital signs: Reviewed and stable  Level of consciousness: sedated  Complications: No apparent anesthesia complications

## 2012-02-02 NOTE — Progress Notes (Signed)
CBG 158 at 1046

## 2012-02-02 NOTE — H&P (Signed)
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  She now presents for endoscopic resection of this sinus/skull base tumor.  Dr. Emeline Darling, [place] has discussed the risks (including recurrence, bleeding, infection, CSF leak, orbital injury), benefits, and alternatives of this procedure. The patient understands the risks and would like to proceed with the procedure. The chances of success of the procedure are >50% and the patient understands this. I personally performed an examination of the patient within 24 hours of the procedure.  PAST MEDICAL HISTORY: Past Medical History  Diagnosis Date  . Hypertension   . History of nuclear stress test     told results were WNL, - several yrs. ago    . Hypothyroidism   . Diabetes mellitus   . Neuromuscular disorder     neuropathy- feet- bilateral   . Arthritis     knees- OA, uses topical treatment     PAST SURGICAL HISTORY: Past Surgical History  Procedure Date  . Abdominal hysterectomy   . Eye surgery     cataracts (bilateral) - removed- /w IOL   . Tonsillectomy     as a youngster  . Appendectomy     MEDICATIONS: No current facility-administered medications on file prior to encounter.   Current Outpatient Prescriptions on File Prior to Encounter  Medication Sig Dispense Refill  . Calcium Carbonate-Vitamin D (CALCIUM 600/VITAMIN D PO) Take 1 tablet by mouth daily.      Marland Kitchen glipiZIDE-metformin (METAGLIP) 5-500 MG per tablet Take 2 tablets by mouth Twice daily.      . hydrochlorothiazide (HYDRODIURIL) 25 MG tablet Take 25 mg by mouth daily.      Marland Kitchen KLOR-CON 10 10 MEQ tablet Take 20 mEq by mouth daily.      [name], [name]      . sodium chloride  (OCEAN) 0.65 % SOLN nasal spray Place 1 spray into the nose 3 (three) times daily as needed. For nasal dryness        ALLERGIES: Allergies  Allergen Reactions  . Aspirin Other (See Comments)    GI distress  . Erythromycin Nausea And Vomiting    SOCIAL HISTORY: History   Social History  . Marital Status: Married    Spouse Name: N/A    Number of Children: N/A  . Years of Education: N/A   Occupational History  . Not on file.   Social History Main Topics  . Smoking status: Never Smoker   . Smokeless tobacco: Not on file  . Alcohol Use: No  . Drug Use: No  . Sexually Active:    Other Topics Concern  . Not on file   Social History Narrative  . No narrative on file    FAMILY HISTORY:History reviewed. No pertinent family history.  REVIEW OF SYSTEMS:  Left nasal mass, otherwise negative x 10 systems except per HPI   PHYSICAL EXAM:  GENERAL:  NAD VITAL SIGNS:   Filed Vitals:   02/02/12 0641  BP: 171/81  Pulse: 98  Temp: 98.3 F (36.8 C)  Resp: 20  SKIN:  Warm, dry HEENT: tonsils surgically absent, oral cavity otherwise clear NECK:  supple LYMPH:  No LAD LUNGS:  Grossly clear CARDIOVASCULAR:  RRR ABDOMEN:  Soft, NT MUSCULOSKELETAL: normal strength PSYCH:  Awake, alert NEUROLOGIC:  CN 2-12 intact  and symmetric  DIAGNOSTIC STUDIES: CT sinuses shows left nasal mass between left middle turbinate and septum consistent with left hemangiopericytoma  ASSESSMENT AND PLAN: Plan to proceed with left total ethmoidectomy, sphenoidotomy, maxillary antrostomy, frontal sinusotomy with resection of middle turbinate and septal skull base tumor. Patient understands the risks, benefits, and alternatives.  02/02/12 7:12 AM Brittney Malone

## 2012-02-02 NOTE — Op Note (Signed)
10:54 AM 02/02/12 Surgeon: [name] Procedure Performed: 61600-Left-left endoscopic resection of tumor, left anterior cranial fossa/skull base, extradural 61782-extradural CT image guidance 31267-Left-left maxillary antrostomy 31255-left-left total ethmoidectomy 31288-left-left sphenoidotomy with tissue removal 31276-left-left frontal sinusotomy 31291-left-left endoscopic repair of left ethmoid/cribriform skull base CSF leak with left nasal floor mucosal graft.  PREOPERATIVE DIAGNOSIS: 1. Left ethmoid/middle turbinate/septal/skull base malignant tumor (hemangiopericytoma) 2. Left ethmoid/cribriform CSF leak  POSTOPERATIVE DIAGNOSIS: 1. Left ethmoid/middle turbinate/septal/skull base malignant tumor (hemangiopericytoma) 2. Left ethmoid/cribriform CSF leak   ANESTHESIA: General endotracheal.  ESTIMATED BLOOD LOSS: Approximately 50mL.  SPECIMENS: left sinus contents, left skull base tumor, left ethmoid, frontal, maxillary, sphenoid, skull base, and bony septal margins  HISTORY OF PRESENT ILLNESS: The patient is a  71yo who has had chronic nasal obstruction and was found to have a left septal/ethmoid skull base/left middle turbinate hemangiopericytoma.  PROCEDURE: The patient was brought to the operating room and placed in the supine position. After adequate endotracheal anesthesia was obtained, the face was draped in sterile fashion. Lidocaine 1% with 1:100,000 epinephrine was injected into the region of the left nasal septum and bilateral greater palatine foramina. The Medtronic fusion image guidance hardware was placed on the forehead and the patient was registered to the image guidance CT with surface matching registration with good accuracy and precision.  The inferior turbinate was outfractured bilaterally and using the 0 degree scope idocaine 1% with 1:100,000 epinephrine was injected in the region of the anterior portion of the left middle turbinate and uncinate process and  septum. The left hemangiopericytoma was noted attached to the left nasal septum and the medial surface fo the left middle turbinate extending up to the ethmoid skull base.  The left  uncinate process was identified. The uncinate process was removed systematically superiorly to inferiorly with back-biting forceps and the microdebrider.. Next, the maxillary antrostomy was identified and expanded with the back-biting forceps and microdebrider, including the natural ostium. The sinus linings did not show any obvious tumor extension. A left maxillary mucosal margin was negative on frozen section.  The anterior and posterior ethmoid air cells were entered and dissected with the curved 55 degree J-curette. I then used the straight Thru-cut to remove the posterior and superior attachments of the left middle turbinate. I then used the 15 blade and the Cottle elevator to elevate a left septal flap anterior to the left hemangiopericytoma. I then elevated the flap up to the skull base and all the way posteriorly to the sphenoid face. I then used the Cottle and straight and 45-degree Thru Cuts to remove the left septal mucosa, left middle turbinate, and left skull base hemangiopericytoma en bloc. I passed this specimen off as left skull base tumor. I sent a margin from the left skull base that was free of tumor. I sent the septal bone as a permanent specimen but grossly the septal bone and cartilage and the right septal mucosa appeared free of any tumor extension.  I then came posteriorly and removed all of the left ethmoid septations using the microdebrider and 3mm Kerrison all the way up to the ethmoid skull base. I identified the left anterior ethmoid artery coursing across the skull base, and I left this intact. I removed all of the left posterior and anterior ethmoid ledges and septations. Of note a pre-existing anterior bony dehiscence of the left lamina papyracea was noted but no orbital contents were exposed.  I  then came posteriorly and identified the left superior turbinate attachment and the left sphenoid  natural ostium using the image guidance suction. I removed the inferior portion of the left superior turbinate and opened the left sphenoid antrostomy widely up to the skull base and out to the left lamina papyrecea using the 3mm Kerrison. I sent a left sphenoid mucosal margin that was negative for tumor on frozen section. The sphenoid appeared grossly free of tumor.  Next I switched to the 45 degree scope and curved debrider. Superior to the left anterior ethmoid artery I identified the left supraorbital ethmoid cell and opened this widely with the 60 degree microdebrider. I then identified the left true frontal sinus ostium and widely opened this with the 60 degree microdebrider. I then connected the left frontal and left supraorbital ethmoid using the front-to-back Kuhn frontal forceps. I sent a left frontal sinus margin that was negative on frozen, and the left frontal sinus appeared free of tumor.  On valsalva there was a low-flow small left posterior and mid-cribriform/ethmoid roof CSF leak from the area where the tumor was attached to the left skull base mucosa and was resected. I cauterized the area of the CSF leak with the bipolar cautery, which stopped the CSF extravasation. I then elevated a left inferior septal/left nasal floor mucosal graft with the Cottle and removed it using the 45 thru-cut. I then marked the mucosal side with blue ink and used the 45 Blakesley to place the mucosal graft with the periosteal side facing the skull base along the skull base with >100% coverage of the CSF leak. I then bolstered the mucosal graft/left ethmoid CSF leak repair with surgicel, Duraseal, and a 5cm left ethmoid fingercot/merocel pack which was taper the the patient's left cheek.  Hemostasis was obtained with the suction Bovie as needed. The image guidance hardware was removed and the patient was awaked from  anesthesia and extubated without difficulty. The patient tolerated the procedure well and returned to the recovery room in stable condition.   Dr. [name] was present and performed the entire procedure. 02/01/1309:54 AM [name]  02/02/12

## 2012-02-02 NOTE — Anesthesia Preprocedure Evaluation (Addendum)
Anesthesia Evaluation  Patient identified by MRN, date of birth, ID band Patient awake    Reviewed: Allergy & Precautions, H&P , NPO status , Patient's Chart, lab work & pertinent test results  Airway Mallampati: II TM Distance: >3 FB Neck ROM: Full    Dental  (+) Teeth Intact and Dental Advisory Given   Pulmonary neg pulmonary ROS,  breath sounds clear to auscultation  Pulmonary exam normal       Cardiovascular hypertension, Pt. on medications Rhythm:Regular Rate:Normal     Neuro/Psych endoscopic resection of left anterior cranial fossa tumor, left maxillary antrostomy, left ethmoidectomy, left sphenoidectomy, left frontal sinusotomy for hemangiopericytoma    GI/Hepatic negative GI ROS, Neg liver ROS,   Endo/Other  Well Controlled, Type 2, Oral Hypoglycemic AgentsHypothyroidism Morbid obesity  Renal/GU negative Renal ROS     Musculoskeletal negative musculoskeletal ROS (+)   Abdominal (+) + obese,   Peds  Hematology negative hematology ROS (+)   Anesthesia Other Findings   Reproductive/Obstetrics negative OB ROS                        Anesthesia Physical Anesthesia Plan  ASA: III  Anesthesia Plan: General   Post-op Pain Management:    Induction: Intravenous  Airway Management Planned: Oral ETT  Additional Equipment:   Intra-op Plan:   Post-operative Plan: Extubation in OR  Informed Consent: I have reviewed the patients History and Physical, chart, labs and discussed the procedure including the risks, benefits and alternatives for the proposed anesthesia with the patient or authorized representative who has indicated his/her understanding and acceptance.   Dental advisory given  Plan Discussed with: CRNA, Anesthesiologist and Surgeon  Anesthesia Plan Comments:         Anesthesia Quick Evaluation

## 2012-02-02 NOTE — Progress Notes (Signed)
Subjective: POD#0 from left resection of skull base hemangiopericytoma, left functional endoscopic sinus surgery with CT image guidance. Doing well, awake, alert, pain well controlled.  Objective: Vital signs in last 24 hours: Temp:  [97.8 F (36.6 C)-98.3 F (36.8 C)] 97.8 F (36.6 C) (08/29 1305) Pulse Rate:  [50-107] 102  (08/29 1305) Resp:  [16-23] 20  (08/29 1305) BP: (117-171)/(55-108) 132/62 mmHg (08/29 1245) SpO2:  [91 %-99 %] 94 % (08/29 1305)  EOMI, PERRLA, vision grossly intact bilaterally. Nasal cavity hemostatic. Left nasal merocel fingercot in place and secure to left cheek. CN 2-12 grossly intact and symmetric bilaterally.  @LABLAST2 (wbc:2,hgb:2,hct:2,plt:2) No results found for this basename: NA:2,K:2,CL:2,CO2:2,GLUCOSE:2,BUN:2,CREATININE:2,CALCIUM:2 in the last 72 hours  Medications:  Scheduled Meds:   . calcium-vitamin D  1 tablet Oral Daily  .  ceFAZolin (ANCEF) IV  1 g Intravenous Q8H  .  ceFAZolin (ANCEF) IV  2 g Intravenous Once  . cholecalciferol  1,000 Units Oral Daily  . Diclofenac Sodium  40 drop Transdermal BID  . glipiZIDE  10 mg Oral BID AC  . hydrochlorothiazide  25 mg Oral Daily  . HYDROmorphone      . insulin aspart  0-15 Units Subcutaneous TID WC  . insulin aspart  0-5 Units Subcutaneous QHS  . levothyroxine  150 mcg Oral QAC breakfast  . metFORMIN  1,000 mg Oral BID WC  . multivitamin with minerals  1 tablet Oral Daily  . naproxen  250 mg Oral Daily  . omega-3 acid ethyl esters  1 g Oral Daily  . potassium chloride  20 mEq Oral Daily  . DISCONTD: Calcium Carbonate-Vitamin D  1 tablet Oral Daily  . DISCONTD: glipiZIDE-metformin  2 tablet Oral BID AC  . DISCONTD: Glucosamine-Chondroitin  1 tablet Oral BID  . DISCONTD: naproxen sodium  220 mg Oral Daily   Continuous Infusions:   . dextrose 5 % and 0.45 % NaCl with KCl 10 mEq/L     PRN Meds:.acetaminophen, hydrALAZINE, HYDROcodone-acetaminophen, morphine injection, ondansetron (ZOFRAN) IV,  ondansetron, phenol, DISCONTD: 0.9 % irrigation (POUR BTL), DISCONTD: droperidol, DISCONTD: hemostatic agents, DISCONTD:  HYDROmorphone (DILAUDID) injection, DISCONTD: lidocaine-EPINEPHrine, DISCONTD: mupirocin nasal ointment, DISCONTD: oxyCODONE, DISCONTD: oxyCODONE, DISCONTD: oxymetazoline, DISCONTD: sodium chloride irrigation  Assessment/Plan: Doing well POD#0 from left endoscopic resection of skull base hemangiopericytoma and left functional endosocpic sinus surgery with CT image guidance with negative tumor margins. COntinue post-op pain control, prophylactic antibiotics while packing is in place. Anticipate D/C in the morning if doing well and will have patient follow up in 2 weeks for packing removal and endoscopy.   LOS: 0 days   [name] 02/02/2012, 2:55 PM

## 2012-02-02 NOTE — Transfer of Care (Signed)
Immediate Anesthesia Transfer of Care Note  Patient: HAE AHLERS  Procedure(s) Performed: Procedure(s) (LRB): MAXILLARY ANTROSTOMY (Left) SPHENOIDECTOMY (Left) ENDOSCOPIC SINUS SURGERY WITH STEALTH (Left) ETHMOIDECTOMY (Left)  Patient Location: PACU  Anesthesia Type: General  Level of Consciousness: awake, alert , oriented and patient cooperative  Airway & Oxygen Therapy: Patient Spontanous Breathing and Patient connected to face mask oxygen  Post-op Assessment: Report given to PACU RN and Post -op Vital signs reviewed and stable  Post vital signs: Reviewed and stable  Complications: No apparent anesthesia complications

## 2012-02-03 ENCOUNTER — Encounter (HOSPITAL_COMMUNITY): Payer: Self-pay | Admitting: Otolaryngology

## 2012-02-03 LAB — CBC
HCT: 35 % — ABNORMAL LOW (ref 36.0–46.0)
Hemoglobin: 11.8 g/dL — ABNORMAL LOW (ref 12.0–15.0)
MCH: 32.7 pg (ref 26.0–34.0)
MCHC: 33.7 g/dL (ref 30.0–36.0)
MCV: 97 fL (ref 78.0–100.0)
Platelets: 190 10*3/uL (ref 150–400)
RBC: 3.61 MIL/uL — ABNORMAL LOW (ref 3.87–5.11)
RDW: 12.4 % (ref 11.5–15.5)
WBC: 10.5 10*3/uL (ref 4.0–10.5)

## 2012-02-03 LAB — BASIC METABOLIC PANEL
BUN: 10 mg/dL (ref 6–23)
CO2: 31 mEq/L (ref 19–32)
Calcium: 8.8 mg/dL (ref 8.4–10.5)
Chloride: 101 mEq/L (ref 96–112)
Creatinine, Ser: 0.77 mg/dL (ref 0.50–1.10)
GFR calc Af Amer: >90 mL/min (ref >90)
GFR calc non Af Amer: 84 mL/min — ABNORMAL LOW (ref >90)
Glucose, Bld: 173 mg/dL — ABNORMAL HIGH (ref 70–99)
Potassium: 3.6 mEq/L (ref 3.5–5.1)
Sodium: 140 mEq/L (ref 135–145)

## 2012-02-03 LAB — GLUCOSE, CAPILLARY
Glucose-Capillary: 145 mg/dL — ABNORMAL HIGH (ref 70–99)
Glucose-Capillary: 180 mg/dL — ABNORMAL HIGH (ref 70–99)

## 2012-02-03 NOTE — Progress Notes (Signed)
Patient discharged to home with instructions, family members at bedside at that time, verbalized understanding, no complaints.

## 2012-02-03 NOTE — Discharge Summary (Signed)
02/03/12 6:17 AM  Date of Admission: 02/02/12 Date of Discharge:02/03/12  Discharge ZO:XWRU, Brittney Malone  Admitting EA:VWUJ, Brittney Malone  Reason for admission/final discharge diagnosis: left septal/skull base hemangiopericytoma  Procedure(s) performed: endoscopic resection of left skull base/septal hemangiopericytoma, left maxillary antrostomy, left ethmoidectomy, sphenoidotomy, frontal sinusotomy, repair of left cribriform CSF leak.  Discharge Condition: good  Discharge Exam: awake, alert, strong voice. Left nasal cavity hemostatic with left merocel/fingercot in place. EOMI, PERRLA, vision grossly intact bilaterally. CN 2-12 intact and symmetric.  Discharge Instructions: No heavy lifting > 25lbs., no strenuous activity, leave nasal packing in place. Rx for hydrocodone, zofran, and keflex are on your chart, follow up with  Dr. Emeline Darling At [facility] in 2 weeks. Regular diet, up ad lib.  Hospital Course: did well post-op after resection of left skull base hemagiopericytoma, left sided functional endoscopic sinus surgery, and repair of left cribriform CSF leak. Discharged POD#1 in good condition.  [name] 6:17 AM 02/03/12

## 2012-02-06 LAB — GLUCOSE, CAPILLARY: Glucose-Capillary: 168 mg/dL — ABNORMAL HIGH (ref 70–99)

## 2012-02-27 ENCOUNTER — Other Ambulatory Visit: Payer: Self-pay | Admitting: Family Medicine

## 2012-02-27 DIAGNOSIS — Z1231 Encounter for screening mammogram for malignant neoplasm of breast: Secondary | ICD-10-CM

## 2012-03-20 ENCOUNTER — Ambulatory Visit
Admission: RE | Admit: 2012-03-20 | Discharge: 2012-03-20 | Disposition: A | Discharge status: Home / Self Care | Payer: Medicare Other | Source: Ambulatory Visit | Attending: Family Medicine | Admitting: Family Medicine

## 2012-03-20 DIAGNOSIS — Z1231 Encounter for screening mammogram for malignant neoplasm of breast: Secondary | ICD-10-CM

## 2012-03-23 ENCOUNTER — Other Ambulatory Visit: Payer: Self-pay | Admitting: Family Medicine

## 2012-03-23 DIAGNOSIS — R928 Other abnormal and inconclusive findings on diagnostic imaging of breast: Secondary | ICD-10-CM

## 2012-04-02 ENCOUNTER — Ambulatory Visit
Admission: RE | Admit: 2012-04-02 | Discharge: 2012-04-02 | Disposition: A | Discharge status: Home / Self Care | Payer: Medicare Other | Source: Ambulatory Visit | Attending: Family Medicine | Admitting: Family Medicine

## 2012-04-02 DIAGNOSIS — R928 Other abnormal and inconclusive findings on diagnostic imaging of breast: Secondary | ICD-10-CM

## 2012-11-20 ENCOUNTER — Ambulatory Visit
Admission: RE | Admit: 2012-11-20 | Discharge: 2012-11-20 | Disposition: A | Discharge status: Home / Self Care | Payer: Medicare Other | Source: Ambulatory Visit | Attending: Family Medicine | Admitting: Family Medicine

## 2012-11-20 ENCOUNTER — Other Ambulatory Visit: Payer: Self-pay | Admitting: Family Medicine

## 2012-11-20 DIAGNOSIS — J45909 Unspecified asthma, uncomplicated: Secondary | ICD-10-CM

## 2012-12-11 ENCOUNTER — Other Ambulatory Visit: Payer: Self-pay | Admitting: Family Medicine

## 2012-12-11 ENCOUNTER — Ambulatory Visit
Admission: RE | Admit: 2012-12-11 | Discharge: 2012-12-11 | Disposition: A | Discharge status: Home / Self Care | Payer: Medicare Other | Source: Ambulatory Visit | Attending: Family Medicine | Admitting: Family Medicine

## 2012-12-11 DIAGNOSIS — J189 Pneumonia, unspecified organism: Secondary | ICD-10-CM

## 2013-01-21 ENCOUNTER — Other Ambulatory Visit: Payer: Self-pay | Admitting: Family Medicine

## 2013-01-21 ENCOUNTER — Ambulatory Visit
Admission: RE | Admit: 2013-01-21 | Discharge: 2013-01-21 | Disposition: A | Discharge status: Home / Self Care | Payer: Medicare Other | Source: Ambulatory Visit | Attending: Family Medicine | Admitting: Family Medicine

## 2013-01-21 DIAGNOSIS — J189 Pneumonia, unspecified organism: Secondary | ICD-10-CM

## 2013-01-22 ENCOUNTER — Other Ambulatory Visit: Payer: Self-pay | Admitting: Family Medicine

## 2013-01-22 DIAGNOSIS — J189 Pneumonia, unspecified organism: Secondary | ICD-10-CM

## 2013-01-23 ENCOUNTER — Ambulatory Visit
Admission: RE | Admit: 2013-01-23 | Discharge: 2013-01-23 | Disposition: A | Discharge status: Home / Self Care | Payer: Medicare Other | Source: Ambulatory Visit | Attending: Family Medicine | Admitting: Family Medicine

## 2013-01-23 DIAGNOSIS — J189 Pneumonia, unspecified organism: Secondary | ICD-10-CM

## 2013-01-23 MED ORDER — IOHEXOL 300 MG/ML  SOLN
75.0000 mL | Freq: Once | INTRAMUSCULAR | Status: AC | PRN
  Administered 2013-01-23: 75 mL via INTRAVENOUS

## 2013-03-01 ENCOUNTER — Encounter (HOSPITAL_COMMUNITY): Payer: Self-pay

## 2013-03-01 ENCOUNTER — Emergency Department (HOSPITAL_COMMUNITY): Payer: Medicare Other

## 2013-03-01 ENCOUNTER — Inpatient Hospital Stay (HOSPITAL_COMMUNITY)
Admission: EM | Admit: 2013-03-01 | Discharge: 2013-03-04 | DRG: 292 | Disposition: A | Discharge status: Home / Self Care | Payer: Medicare Other | Attending: Cardiology | Admitting: Cardiology

## 2013-03-01 DIAGNOSIS — I5021 Acute systolic (congestive) heart failure: Secondary | ICD-10-CM | POA: Diagnosis present

## 2013-03-01 DIAGNOSIS — I959 Hypotension, unspecified: Secondary | ICD-10-CM | POA: Diagnosis not present

## 2013-03-01 DIAGNOSIS — R000 Tachycardia, unspecified: Secondary | ICD-10-CM

## 2013-03-01 DIAGNOSIS — I471 Supraventricular tachycardia, unspecified: Secondary | ICD-10-CM | POA: Diagnosis present

## 2013-03-01 DIAGNOSIS — I5023 Acute on chronic systolic (congestive) heart failure: Principal | ICD-10-CM | POA: Diagnosis present

## 2013-03-01 DIAGNOSIS — Z8701 Personal history of pneumonia (recurrent): Secondary | ICD-10-CM

## 2013-03-01 DIAGNOSIS — I4891 Unspecified atrial fibrillation: Secondary | ICD-10-CM

## 2013-03-01 DIAGNOSIS — I4719 Other supraventricular tachycardia: Secondary | ICD-10-CM | POA: Diagnosis present

## 2013-03-01 DIAGNOSIS — E876 Hypokalemia: Secondary | ICD-10-CM | POA: Diagnosis present

## 2013-03-01 DIAGNOSIS — E119 Type 2 diabetes mellitus without complications: Secondary | ICD-10-CM

## 2013-03-01 DIAGNOSIS — E785 Hyperlipidemia, unspecified: Secondary | ICD-10-CM

## 2013-03-01 DIAGNOSIS — M1710 Unilateral primary osteoarthritis, unspecified knee: Secondary | ICD-10-CM | POA: Diagnosis present

## 2013-03-01 DIAGNOSIS — E039 Hypothyroidism, unspecified: Secondary | ICD-10-CM | POA: Diagnosis present

## 2013-03-01 DIAGNOSIS — G5790 Unspecified mononeuropathy of unspecified lower limb: Secondary | ICD-10-CM | POA: Diagnosis present

## 2013-03-01 DIAGNOSIS — Z79899 Other long term (current) drug therapy: Secondary | ICD-10-CM

## 2013-03-01 DIAGNOSIS — I428 Other cardiomyopathies: Secondary | ICD-10-CM | POA: Diagnosis present

## 2013-03-01 DIAGNOSIS — I10 Essential (primary) hypertension: Secondary | ICD-10-CM | POA: Diagnosis present

## 2013-03-01 DIAGNOSIS — E669 Obesity, unspecified: Secondary | ICD-10-CM

## 2013-03-01 HISTORY — DX: Acute systolic (congestive) heart failure: I50.21

## 2013-03-01 HISTORY — DX: Morbid (severe) obesity due to excess calories: E66.01

## 2013-03-01 HISTORY — DX: Hyperlipidemia, unspecified: E78.5

## 2013-03-01 HISTORY — DX: Personal history of pneumonia (recurrent): Z87.01

## 2013-03-01 HISTORY — DX: Type 2 diabetes mellitus without complications: E11.9

## 2013-03-01 LAB — CBC
HCT: 43.7 % (ref 36.0–46.0)
HCT: 41.7 % (ref 36.0–46.0)
Hemoglobin: 15.2 g/dL — ABNORMAL HIGH (ref 12.0–15.0)
Hemoglobin: 14.7 g/dL (ref 12.0–15.0)
MCH: 31.9 pg (ref 26.0–34.0)
MCH: 32.5 pg (ref 26.0–34.0)
MCHC: 34.8 g/dL (ref 30.0–36.0)
MCHC: 35.3 g/dL (ref 30.0–36.0)
MCV: 91.8 fL (ref 78.0–100.0)
MCV: 92.3 fL (ref 78.0–100.0)
Platelets: 278 10*3/uL (ref 150–400)
Platelets: 237 10*3/uL (ref 150–400)
RBC: 4.76 MIL/uL (ref 3.87–5.11)
RBC: 4.52 MIL/uL (ref 3.87–5.11)
RDW: 12.9 % (ref 11.5–15.5)
RDW: 13 % (ref 11.5–15.5)
WBC: 9.1 10*3/uL (ref 4.0–10.5)
WBC: 8.8 10*3/uL (ref 4.0–10.5)

## 2013-03-01 LAB — BASIC METABOLIC PANEL
BUN: 14 mg/dL (ref 6–23)
CO2: 27 mEq/L (ref 19–32)
Calcium: 9.6 mg/dL (ref 8.4–10.5)
Chloride: 98 mEq/L (ref 96–112)
Creatinine, Ser: 0.82 mg/dL (ref 0.50–1.10)
GFR calc Af Amer: 82 mL/min — ABNORMAL LOW (ref >90)
GFR calc non Af Amer: 71 mL/min — ABNORMAL LOW (ref >90)
Glucose, Bld: 139 mg/dL — ABNORMAL HIGH (ref 70–99)
Potassium: 4 mEq/L (ref 3.5–5.1)
Sodium: 140 mEq/L (ref 135–145)

## 2013-03-01 LAB — POCT I-STAT TROPONIN I: Troponin i, poc: 0.01 ng/mL (ref 0.00–0.08)

## 2013-03-01 LAB — GLUCOSE, CAPILLARY: Glucose-Capillary: 199 mg/dL — ABNORMAL HIGH (ref 70–99)

## 2013-03-01 LAB — PRO B NATRIURETIC PEPTIDE: Pro B Natriuretic peptide (BNP): 1506 pg/mL — ABNORMAL HIGH (ref 0–125)

## 2013-03-01 LAB — CREATININE, SERUM
Creatinine, Ser: 0.76 mg/dL (ref 0.50–1.10)
GFR calc Af Amer: >90 mL/min (ref >90)
GFR calc non Af Amer: 83 mL/min — ABNORMAL LOW (ref >90)

## 2013-03-01 MED ORDER — ASPIRIN EC 81 MG PO TBEC
81.0000 mg | DELAYED_RELEASE_TABLET | Freq: Every day | ORAL | Status: DC
  Administered 2013-03-02 – 2013-03-03 (×2): 81 mg via ORAL
  Filled 2013-03-01 (×4): qty 1

## 2013-03-01 MED ORDER — METOPROLOL TARTRATE 25 MG PO TABS
25.0000 mg | ORAL_TABLET | Freq: Two times a day (BID) | ORAL | Status: DC
  Administered 2013-03-01: 25 mg via ORAL
  Filled 2013-03-01 (×3): qty 1

## 2013-03-01 MED ORDER — LISINOPRIL 2.5 MG PO TABS
2.5000 mg | ORAL_TABLET | Freq: Every day | ORAL | Status: DC
  Administered 2013-03-01 – 2013-03-03 (×2): 2.5 mg via ORAL
  Filled 2013-03-01 (×4): qty 1

## 2013-03-01 MED ORDER — DILTIAZEM HCL 100 MG IV SOLR
5.0000 mg/h | INTRAVENOUS | Status: DC
  Administered 2013-03-01 – 2013-03-02 (×2): 5 mg/h via INTRAVENOUS
  Filled 2013-03-01 (×2): qty 100

## 2013-03-01 MED ORDER — SODIUM CHLORIDE 0.9 % IV SOLN: 250.0000 mL | INTRAVENOUS | Status: DC | PRN

## 2013-03-01 MED ORDER — HEPARIN SODIUM (PORCINE) 5000 UNIT/ML IJ SOLN
5000.0000 [IU] | Freq: Three times a day (TID) | INTRAMUSCULAR | Status: DC
Start: 2013-03-01 — End: 2013-03-04
  Administered 2013-03-01 – 2013-03-04 (×9): 5000 [IU] via SUBCUTANEOUS
  Filled 2013-03-01 (×12): qty 1

## 2013-03-01 MED ORDER — BUDESONIDE-FORMOTEROL FUMARATE 80-4.5 MCG/ACT IN AERO
2.0000 | INHALATION_SPRAY | Freq: Two times a day (BID) | RESPIRATORY_TRACT | Status: DC
  Administered 2013-03-01 – 2013-03-03 (×5): 2 via RESPIRATORY_TRACT
  Filled 2013-03-01: qty 6.9

## 2013-03-01 MED ORDER — DIGOXIN 125 MCG PO TABS
0.1250 mg | ORAL_TABLET | Freq: Every day | ORAL | Status: DC
Start: 2013-03-01 — End: 2013-03-04
  Administered 2013-03-01 – 2013-03-03 (×3): 0.125 mg via ORAL
  Filled 2013-03-01 (×4): qty 1

## 2013-03-01 MED ORDER — FUROSEMIDE 10 MG/ML IJ SOLN
40.0000 mg | Freq: Two times a day (BID) | INTRAMUSCULAR | Status: DC
  Administered 2013-03-01 – 2013-03-03 (×3): 40 mg via INTRAVENOUS
  Filled 2013-03-01 (×7): qty 4

## 2013-03-01 MED ORDER — ACETAMINOPHEN 325 MG PO TABS: 650.0000 mg | ORAL_TABLET | ORAL | Status: DC | PRN

## 2013-03-01 MED ORDER — SODIUM CHLORIDE 0.9 % IJ SOLN: 3.0000 mL | INTRAMUSCULAR | Status: DC | PRN

## 2013-03-01 MED ORDER — DILTIAZEM LOAD VIA INFUSION
20.0000 mg | Freq: Once | INTRAVENOUS | Status: AC
  Administered 2013-03-01: 20 mg via INTRAVENOUS
  Filled 2013-03-01 (×2): qty 20

## 2013-03-01 MED ORDER — GLIPIZIDE 5 MG PO TABS
5.0000 mg | ORAL_TABLET | Freq: Two times a day (BID) | ORAL | Status: DC
  Administered 2013-03-02 – 2013-03-04 (×5): 5 mg via ORAL
  Filled 2013-03-01 (×7): qty 1

## 2013-03-01 MED ORDER — ONDANSETRON HCL 4 MG/2ML IJ SOLN: 4.0000 mg | Freq: Four times a day (QID) | INTRAMUSCULAR | Status: DC | PRN

## 2013-03-01 MED ORDER — SODIUM CHLORIDE 0.9 % IJ SOLN
3.0000 mL | Freq: Two times a day (BID) | INTRAMUSCULAR | Status: DC
  Administered 2013-03-01 – 2013-03-03 (×5): 3 mL via INTRAVENOUS

## 2013-03-01 MED ORDER — LEVOTHYROXINE SODIUM 150 MCG PO TABS
150.0000 ug | ORAL_TABLET | Freq: Every day | ORAL | Status: DC
  Administered 2013-03-02: 150 ug via ORAL
  Filled 2013-03-01 (×2): qty 1

## 2013-03-01 NOTE — ED Notes (Signed)
PA at bedside.

## 2013-03-01 NOTE — H&P (Addendum)
Admit date: 03/01/2013 Primary Physician  Carver Fila, RN Primary Cardiologist  None  CC: Acute systolic heart failure, shortness of breath, tachycardia  HPI: 72 year-old female with no prior cardiovascular history, diabetes, hypertension, obesity who since May has been battling with various bouts of pneumonia, lower extremity edema, cough, shortness of breath, orthopnea. She has been treated with 3 separate rounds of antibiotics. She has had several chest x-rays as well as CT scan. Consolidation noted as well as pleural effusion. She has been sleeping in a recliner.  Today, she was in office for echocardiogram where her heart rate was noted to be in the 130 range. It appeared to be sinus tachycardia. Echo sonographer noted markedly reduced ejection fraction of 15-25%.  She does not smoke, never uses alcohol. Her thyroid condition is well controlled. No recent joint effusions or dysphagia. No recent bleeding.    PMH:   Past Medical History  Diagnosis Date  . Hypertension   . History of nuclear stress test     told results were WNL, - several yrs. ago    . Hypothyroidism   . Diabetes mellitus   . Neuromuscular disorder     neuropathy- feet- bilateral   . Arthritis     knees- OA, uses topical treatment     PSH:   Past Surgical History  Procedure Laterality Date  . Abdominal hysterectomy    . Eye surgery      cataracts (bilateral) - removed- /w IOL   . Tonsillectomy      as a youngster  . Appendectomy    . Maxillary antrostomy  02/02/2012    Procedure: MAXILLARY ANTROSTOMY;  Surgeon: [name], MD;  Location: [facility] OR;  Service: ENT;  Laterality: Left;  Left Maxillary Antrostomy [id number]  . Sphenoidectomy  02/02/2012    Procedure: SPHENOIDECTOMY;  Surgeon: [name], MD;  Location: [facility] OR;  Service: ENT;  Laterality: Left;  Left Sphenoidectomy [phone]  . 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 Ethmoidectomy  02/02/2012    Procedure: ETHMOIDECTOMY;  Surgeon: [name], MD;  Location: [facility] OR;  Service: ENT;  Laterality: Left;  Left Ethmoidectomy [phone] with repair of CSF leak   Allergies:  Aspirin and Erythromycin Prior to Admit Meds:   Prior to Admission medications   Medication Sig Start Date End Date Taking? Authorizing Provider  budesonide-formoterol (SYMBICORT) 80-4.5 MCG/ACT inhaler Inhale 2 puffs into the lungs 2 (two) times daily.   Yes Historical Provider, MD  Calcium Carbonate-Vitamin D (CALCIUM 600/VITAMIN D PO) Take 1 tablet by mouth daily.   Yes Historical Provider, MD  cholecalciferol (VITAMIN D) 1000 UNITS tablet Take 1,000 Units by mouth daily.   Yes Historical Provider, MD  Diclofenac Sodium (PENNSAID) 1.5 % SOLN Place 40 drops onto the skin 2 (two) times daily.   Yes Historical Provider, MD  furosemide (LASIX) 20 MG tablet Take 20 mg by mouth 2 (two) times daily.   Yes Historical Provider, MD  glipiZIDE-metformin (METAGLIP) 5-500 MG per tablet Take 2 tablets by mouth 2 (two) times daily before a meal.  01/20/12  Yes Historical Provider, MD  Glucosamine-Chondroitin (OSTEO BI-FLEX REGULAR STRENGTH PO) Take 1 tablet by mouth 2 (two) times daily.   Yes Historical Provider, MD  KLOR-CON 10 10 MEQ tablet  Take 20 mEq by mouth daily. 12/26/11  Yes Historical Provider, MD  levothyroxine (SYNTHROID, [name] 12/26/11  Yes Historical Provider, MD  naproxen sodium (ALEVE) 220 MG tablet Take 220 mg by mouth daily.   Yes Historical Provider, MD  Omega-3 Fatty Acids (FISH OIL) 1200 MG CAPS Take 1,200 mg by mouth 2 (two) times daily.   Yes Historical Provider, MD  OVER THE COUNTER MEDICATION Apply 1 application topically at bedtime. Physassist Foot Pain Cream   Yes  Historical Provider, MD  Plant Sterols and Stanols (CHOLEST OFF) 450 MG TABS Take 2 tablets by mouth 2 (two) times daily.   Yes Historical Provider, MD   Fam HX:   History reviewed. No pertinent family history. Social HX:    History   Social History  . Marital Status: Married    Spouse Name: N/A    Number of Children: N/A  . Years of Education: N/A   Occupational History  . Not on file.   Social History Main Topics  . Smoking status: Never Smoker   . Smokeless tobacco: Not on file  . Alcohol Use: No  . Drug Use: No  . Sexual Activity:    Other Topics Concern  . Not on file   Social History Narrative  . No narrative on file     ROS:  All 11 ROS were addressed and are negative except what is stated in the HPI   Physical Exam: Blood pressure 103/71, pulse 103, temperature 98 F (36.7 C), resp. rate 17, height 5\' 2"  (1.575 m), weight 90.266 kg (199 lb), SpO2 92.00%.   General: Well developed, well nourished, in no acute distress Head: Eyes PERRLA, No xanthomas.   Normal cephalic and atramatic  Lungs:  Clear bilaterally to auscultation and percussion. Normal respiratory effort. No wheezes, no rales. Heart:  Mildly tachycardic, regular with occasional ectopy S1 S2 Pulses are 2+ & equal. No murmurs, rubs or gallops.            No carotid bruit. Difficult to judge JVD, thick neck.   Abdomen: Bowel sounds are positive, abdomen soft and non-tender without masses or  No abdominal bruits. No hepatosplenomegaly. Msk:  Back normal, normal gait. Normal strength and tone for age. Extremities:  No clubbing, cyanosis, trace edema.  DP +1 Neuro: Alert and oriented X 3, non-focal, MAE x 4 GU: Deferred Rectal: Deferred Psych:  Good affect, responds appropriately         Labs:   Lab Results  Component Value Date   WBC 9.1 03/01/2013   HGB 15.2* 03/01/2013   HCT 43.7 03/01/2013   MCV 91.8 03/01/2013   PLT 278 03/01/2013    Recent Labs Lab 03/01/13 1255  NA 140  K 4.0  CL 98   CO2 27  BUN 14  CREATININE 0.82  CALCIUM 9.6  GLUCOSE 139*     Radiology:  Dg Chest 2 View  03/01/2013   CLINICAL DATA:  Palpitations  EXAM: CHEST  2 VIEW  FINDINGS: Progressive elevation right hemidiaphragm. Progressive right lower lobe atelectasis. Left lung is clear  Cardiac enlargement. Mild vascular congestion without edema or effusion.  IMPRESSION: Increased right lower lobe atelectasis.  Pulmonary vascular congestion without edema.  COMPARISON:  THE: COMPARISON:  THE 01/21/2013   Electronically Signed   By: Marlan Palau M.D.   On: 03/01/2013 13:00   Personally viewed.   EKG:  EKG appears to be sinus tachycardia with  occasional PACs. I do not believe this is atrial fibrillation. Personally viewed.  Echocardiogram: 03/01/13-ejection fraction 15-25%. Markedly reduced.  ASSESSMENT/PLAN:   72 year old female with newly discovered tachycardia, acute systolic heart failure, newly discovered cardiomyopathy, diabetes, hypothyroidism, hypertension, hyperlipidemia with recent bouts of pneumonia.  1. Cardiomyopathy  - Possible viral cardiomyopathy in the setting of recent respiratory illnesses.  - Will need to exclude coronary artery disease at some point but angiography.  - Sleep apnea may be playing a role. Will need a sleep study at some point if not already done.  - For now, I will discontinue 5 mg of IV diltiazem due to negative inotropic effects and will replace with very low-dose beta blocker metoprolol 25 mg by mouth twice a day. - We will utilize ACE inhibitor as blood pressure is able to tolerate. - Check TSH and free T4. - I wonder if her multiple bouts of pneumonia/shortness of breath may have been related to a dilated cardiomyopathy and in fact systolic heart failure.  - I will start digoxin. - If unable to tolerate aspirin due to GI distress, we will discontinue.  2. Diabetes  - Insulin sliding scale. Home medications.  3. Hypothyroidism  - Home medications. Checking  thyroid function.  4. Obesity  - Continue to encourage weight loss.  She will be here over the weekend. We've discussed the plan. If tachycardia not improved with beta blocker, may need electrophysiology consultation for further discussion. Could this be an atrial tachycardia? I think that this is doubtful given her fluctuation in heart rate. It is also possible that her tachycardia is merely compensatory because of her markedly decreased cardiac output. If cardiac function does not improve over time, advanced heart failure therapy such as defibrillator may be warranted.  Donato Schultz, MD  03/01/2013  4:02 PM

## 2013-03-01 NOTE — ED Notes (Signed)
Lab tech at the bedside.

## 2013-03-01 NOTE — Progress Notes (Signed)
BG 190, no insulin sliding scale.  MD on call informed.  No new orders.  BG trend to be monitored prior to initiation of insulin coverage.

## 2013-03-01 NOTE — ED Notes (Signed)
Pt reports she was at her PCP today and they instructed her to come to the ED due to a heart rate of 140 bmp. Pt does report feeling SOB, states she was just diagnosed and treated for Pneumonia and thought that was the cause of her SOB. Pt denies chest pain, N/V, back/abd pain

## 2013-03-01 NOTE — ED Provider Notes (Signed)
CSN: [id number]     Arrival date & time 03/01/13  1217 History   First MD Initiated Contact with Patient 03/01/13 1245     Chief Complaint  Patient presents with  . Palpitations   (Consider location/radiation/quality/duration/timing/severity/associated sxs/prior Treatment) HPI Comments: Patient here from her PCP - she states that she was scheduled for an echocardiogram and when she arrived and they hooked her up they noted that her heart rate was quite rapid and irregular - she reports that she had been being treated for PNA so the only symptoms that she had were shortness of breath.  She denies chest pain, nausea, diaphoresis, vomiting, back or abdominal pain.  Reports because of the pneumonia she had developed some excess fluid so she had been on lasix for this.   Patient is a 72 y.o. female presenting with palpitations. The history is provided by the patient and the spouse. No language interpreter was used.  Palpitations Palpitations quality:  Irregular Onset quality:  Unable to specify Timing:  Constant Chronicity:  New Context: not anxiety, not bronchodilators, not caffeine, not illicit drugs, not nicotine and not stimulant use   Relieved by:  Nothing Worsened by:  Nothing tried Ineffective treatments:  None tried Associated symptoms: shortness of breath   Associated symptoms: no back pain, no chest pain, no chest pressure, no diaphoresis, no dizziness, no hemoptysis, no leg pain, no near-syncope, no numbness, no orthopnea, no vomiting and no weakness   Risk factors: diabetes mellitus   Risk factors: no hx of atrial fibrillation and no hx of PE     Past Medical History  Diagnosis Date  . Hypertension   . History of nuclear stress test     told results were WNL, - several yrs. ago    . Hypothyroidism   . Diabetes mellitus   . Neuromuscular disorder     neuropathy- feet- bilateral   . Arthritis     knees- OA, uses topical treatment    Past Surgical History  Procedure  Laterality Date  . Abdominal hysterectomy    . Eye surgery      cataracts (bilateral) - removed- /w IOL   . Tonsillectomy      as a youngster  . Appendectomy    . Maxillary antrostomy  02/02/2012    Procedure: MAXILLARY ANTROSTOMY;  Surgeon: [name], MD;  Location: [facility] OR;  Service: ENT;  Laterality: Left;  Left Maxillary Antrostomy [id number]  . Sphenoidectomy  02/02/2012    Procedure: SPHENOIDECTOMY;  Surgeon: [name], MD;  Location: [facility] OR;  Service: ENT;  Laterality: Left;  Left Sphenoidectomy [phone]  . Nasal sinus surgery  02/02/2012    Procedure: ENDOSCOPIC SINUS SURGERY WITH STEALTH;  Surgeon: [name], MD;  Location: [facility] OR;  Service: ENT;  Laterality: Left;  Left Frontal Sinusotomy [id number], and endoscopic resection of left anterior cranial fossa tumor [id number]  . Ethmoidectomy  02/02/2012    Procedure: ETHMOIDECTOMY;  Surgeon: [name], MD;  Location: [facility] OR;  Service: ENT;  Laterality: Left;  Left Ethmoidectomy [phone] with repair of CSF leak   History reviewed. No pertinent family history. History  Substance Use Topics  . Smoking status: Never Smoker   . Smokeless tobacco: Not on file  . Alcohol Use: No   OB History   Grav Para Term Preterm Abortions TAB SAB Ect Mult Living                 Review of Systems  Constitutional: Negative for diaphoresis.  Respiratory:  Positive for shortness of breath. Negative for hemoptysis.   Cardiovascular: Positive for palpitations. Negative for chest pain, orthopnea and near-syncope.  Gastrointestinal: Negative for vomiting.  Musculoskeletal: Negative for back pain.  Neurological: Negative for dizziness and numbness.  All other systems reviewed and are negative.    Allergies  Aspirin and Erythromycin  Home Medications   Current Outpatient Rx  Name  Route  Sig  Dispense  Refill  . budesonide-formoterol (SYMBICORT) 80-4.5 MCG/ACT inhaler   Inhalation   Inhale 2 puffs into the lungs 2 (two) times daily.         . Calcium  Carbonate-Vitamin D (CALCIUM 600/VITAMIN D PO)   Oral   Take 1 tablet by mouth daily.         . cholecalciferol (VITAMIN D) 1000 UNITS tablet   Oral   Take 1,000 Units by mouth daily.         . Diclofenac Sodium (PENNSAID) 1.5 % SOLN   Transdermal   Place 40 drops onto the skin 2 (two) times daily.         . furosemide (LASIX) 20 MG tablet   Oral   Take 20 mg by mouth 2 (two) times daily.         Marland Kitchen glipiZIDE-metformin (METAGLIP) 5-500 MG per tablet   Oral   Take 2 tablets by mouth 2 (two) times daily before a meal.          . Glucosamine-Chondroitin (OSTEO BI-FLEX REGULAR STRENGTH PO)   Oral   Take 1 tablet by mouth 2 (two) times daily.         Marland Kitchen KLOR-CON 10 10 MEQ tablet   Oral   Take 20 mEq by mouth daily.         [name], LEVOTHROID) 150 MCG tablet   Oral   Take 150 mcg by mouth daily before breakfast.         . naproxen sodium (ALEVE) 220 MG tablet   Oral   Take 220 mg by mouth daily.         . Omega-3 Fatty Acids (FISH OIL) 1200 MG CAPS   Oral   Take 1,200 mg by mouth 2 (two) times daily.         Marland Kitchen OVER THE COUNTER MEDICATION   Topical   Apply 1 application topically at bedtime. Physassist Foot Pain Cream         . Plant Sterols and Stanols (CHOLEST OFF) 450 MG TABS   Oral   Take 2 tablets by mouth 2 (two) times daily.          BP 116/77  Pulse 132  Temp(Src) 98 F (36.7 C)  Resp 23  Ht 5\' 2"  (1.575 m)  Wt 199 lb (90.266 kg)  BMI 36.39 kg/m2  SpO2 96% Physical Exam  Nursing note and vitals reviewed. Constitutional: She is oriented to person, place, and time. She appears well-developed and well-nourished. No distress.  HENT:  Head: Normocephalic and atraumatic.  Right Ear: External ear normal.  Left Ear: External ear normal.  Nose: Nose normal.  Mouth/Throat: Oropharynx is clear and moist. No oropharyngeal exudate.  Eyes: Conjunctivae are normal. Pupils are equal, round, and reactive to light. No scleral  icterus.  Neck: Normal range of motion. Neck supple.  Cardiovascular: Normal heart sounds and normal pulses.  An irregularly irregular rhythm present. Tachycardia present.   Pulmonary/Chest: Effort normal and breath sounds normal. No respiratory distress. She has no wheezes. She has no rales. She  exhibits no tenderness.  Abdominal: Soft. Bowel sounds are normal. She exhibits no distension. There is no tenderness. There is no rebound.  Musculoskeletal: Normal range of motion. She exhibits no edema and no tenderness.  Lymphadenopathy:    She has no cervical adenopathy.  Neurological: She is alert and oriented to person, place, and time. She exhibits normal muscle tone. Coordination normal.  Skin: Skin is warm and dry. No rash noted. No erythema. No pallor.  Psychiatric: She has a normal mood and affect. Her behavior is normal. Judgment and thought content normal.    ED Course  Procedures (including critical care time) Labs Review Labs Reviewed  CBC - Abnormal; Notable for the following:    Hemoglobin 15.2 (*)    All other components within normal limits  BASIC METABOLIC PANEL - Abnormal; Notable for the following:    Glucose, Bld 139 (*)    GFR calc non Af Amer 71 (*)    GFR calc Af Amer 82 (*)    All other components within normal limits  PRO B NATRIURETIC PEPTIDE - Abnormal; Notable for the following:    Pro B Natriuretic peptide (BNP) 1506.0 (*)    All other components within normal limits  POCT I-STAT TROPONIN I   Imaging Review Dg Chest 2 View  03/01/2013   CLINICAL DATA:  Palpitations  EXAM: CHEST  2 VIEW  FINDINGS: Progressive elevation right hemidiaphragm. Progressive right lower lobe atelectasis. Left lung is clear  Cardiac enlargement. Mild vascular congestion without edema or effusion.  IMPRESSION: Increased right lower lobe atelectasis.  Pulmonary vascular congestion without edema.  COMPARISON:  THE: COMPARISON:  THE 01/21/2013   Electronically Signed   By: Marlan Palau  M.D.   On: 03/01/2013 13:00    Date: 03/01/2013  Rate: 138  Rhythm: Atrial fibrillation with RVR  QRS Axis: left axis deviation  Intervals: normal  ST/T Wave abnormalities: septal infarct  Conduction Disutrbances:none  Narrative Interpretation: Reviewed by dr. Rosalia Hammers  Old EKG Reviewed: None available   MDM  Atrial fibrillation with RVR Pulmonary congestion  Have spoken with Dr. Anne Fu with [facility] who will see the patient in the ER and admit her - this is new onset of Afib with RVR and some congestive failure.     Izola Price Marisue Humble, PA-C 03/01/13 1418

## 2013-03-01 NOTE — ED Notes (Signed)
Cardiology at bedside.

## 2013-03-01 NOTE — Plan of Care (Signed)
Problem: Consults Goal: Heart Failure Patient Education (See Patient Education module for education specifics.)  Outcome: Not Met (add Reason) Patient is still trying to understand Heart Failure versus Pulmonary Congestion/Pneumonia

## 2013-03-01 NOTE — ED Notes (Signed)
Family at bedside.husband

## 2013-03-01 NOTE — ED Notes (Addendum)
Pt sent here by PCP when found to be in A fbi in RVR. Denies any pain, chest discomfort. Pt report SOB x several month. Recently dx and tx pneumonia in July. Family at bedside. Pt ambulates with no noted difficulty. Denies dizziness, lightheadedness.

## 2013-03-02 LAB — BASIC METABOLIC PANEL
BUN: 16 mg/dL (ref 6–23)
CO2: 28 mEq/L (ref 19–32)
Calcium: 9.3 mg/dL (ref 8.4–10.5)
Chloride: 98 mEq/L (ref 96–112)
Creatinine, Ser: 0.75 mg/dL (ref 0.50–1.10)
GFR calc Af Amer: >90 mL/min (ref >90)
GFR calc non Af Amer: 84 mL/min — ABNORMAL LOW (ref >90)
Glucose, Bld: 156 mg/dL — ABNORMAL HIGH (ref 70–99)
Potassium: 3.2 mEq/L — ABNORMAL LOW (ref 3.5–5.1)
Sodium: 139 mEq/L (ref 135–145)

## 2013-03-02 LAB — GLUCOSE, CAPILLARY
Glucose-Capillary: 148 mg/dL — ABNORMAL HIGH (ref 70–99)
Glucose-Capillary: 160 mg/dL — ABNORMAL HIGH (ref 70–99)
Glucose-Capillary: 199 mg/dL — ABNORMAL HIGH (ref 70–99)
Glucose-Capillary: 250 mg/dL — ABNORMAL HIGH (ref 70–99)

## 2013-03-02 LAB — HEPATIC FUNCTION PANEL
ALT: 16 U/L (ref 0–35)
AST: 16 U/L (ref 0–37)
Albumin: 3.2 g/dL — ABNORMAL LOW (ref 3.5–5.2)
Alkaline Phosphatase: 49 U/L (ref 39–117)
Bilirubin, Direct: 0.1 mg/dL (ref 0.0–0.3)
Indirect Bilirubin: 0.6 mg/dL (ref 0.3–0.9)
Total Bilirubin: 0.7 mg/dL (ref 0.3–1.2)
Total Protein: 6.3 g/dL (ref 6.0–8.3)

## 2013-03-02 LAB — TSH: TSH: 1.165 u[IU]/mL (ref 0.350–4.500)

## 2013-03-02 LAB — T4, FREE: Free T4: 1.93 ng/dL — ABNORMAL HIGH (ref 0.80–1.80)

## 2013-03-02 MED ORDER — LEVOTHYROXINE SODIUM 125 MCG PO TABS
125.0000 ug | ORAL_TABLET | Freq: Every day | ORAL | Status: DC
  Administered 2013-03-03 – 2013-03-04 (×2): 125 ug via ORAL
  Filled 2013-03-02 (×3): qty 1

## 2013-03-02 MED ORDER — POTASSIUM CHLORIDE 20 MEQ/15ML (10%) PO LIQD
40.0000 meq | Freq: Once | ORAL | Status: AC
  Administered 2013-03-02: 40 meq via ORAL
  Filled 2013-03-02: qty 30

## 2013-03-02 MED ORDER — CARVEDILOL 3.125 MG PO TABS
3.1250 mg | ORAL_TABLET | Freq: Two times a day (BID) | ORAL | Status: DC
  Administered 2013-03-03 – 2013-03-04 (×3): 3.125 mg via ORAL
  Filled 2013-03-02 (×6): qty 1

## 2013-03-02 NOTE — Progress Notes (Signed)
Subjective:  Some hypotension noted overnight. Diltiazem drip was discontinued. She was started on metoprolol 25 twice a day yesterday. She is not short of breath today at rest but does have dyspnea with exertion. No chest pain. BNP mildly elevated.  Objective:  Vital Signs in the last 24 hours: BP 91/56  Pulse 98  Temp(Src) 98.1 F (36.7 C) (Oral)  Resp 18  Ht 5\' 2"  (1.575 m)  Wt 90.447 kg (199 lb 6.4 oz)  BMI 36.46 kg/m2  SpO2 99%  Physical Exam: Pleasant obese female in no acute distress Lungs:  Mild rales  Cardiac:  Regular rhythm, normal S1 and S2,  soft S3 noted  Abdomen:  Soft, nontender, no masses Extremities:  1+ edema  present  Intake/Output from previous day: 09/26 0701 - 09/27 0700 In: 446.9 [P.O.:400; I.V.:46.9] Out: 870 [Urine:870] Weight Filed Weights   03/01/13 1223 03/01/13 1734 03/02/13 0524  Weight: 90.266 kg (199 lb) 91.3 kg (201 lb 4.5 oz) 90.447 kg (199 lb 6.4 oz)    Lab Results: Basic Metabolic Panel:  Recent Labs  40/98/11 1255 03/01/13 1820 03/02/13 0540  NA 140  --  139  K 4.0  --  3.2*  CL 98  --  98  CO2 27  --  28  GLUCOSE 139*  --  156*  BUN 14  --  16  CREATININE 0.82 0.76 0.75    CBC:  Recent Labs  03/01/13 1255 03/01/13 1820  WBC 9.1 8.8  HGB 15.2* 14.7  HCT 43.7 41.7  MCV 91.8 92.3  PLT 278 237    BNP    Component Value Date/Time   PROBNP 1506.0* 03/01/2013 1300    Telemetry: Sinus rhythm with rate now of 98  Assessment/Plan:  1. Acute on chronic systolic heart failure 2. Cardiomyopathy 3. Tachycardia 4. Hypotension  Recommendations:  Her blood pressure has been low and will stop her metoprolol and change her to low-dose carvedilol. Hold medicines if pressures less than 80. Continue mild diuresis.   Darden Palmer  MD [facility]  03/02/2013, 11:21 AM

## 2013-03-02 NOTE — Progress Notes (Signed)
At 0200H, BP at 70's/50's, multiple checks, HR 80's NSR on telemetry.  Cardizem drip stopped.  Dr. Tresa Endo informed.  Patient asymptomatic.  Will continue to monitor and evaluate patient.

## 2013-03-02 NOTE — Progress Notes (Signed)
Pt requested metformin for CBG maintenance.  She stated she takes a combination of glipizide/metformin at home.  Physician was notified.

## 2013-03-03 LAB — GLUCOSE, CAPILLARY
Glucose-Capillary: 184 mg/dL — ABNORMAL HIGH (ref 70–99)
Glucose-Capillary: 348 mg/dL — ABNORMAL HIGH (ref 70–99)
Glucose-Capillary: 171 mg/dL — ABNORMAL HIGH (ref 70–99)
Glucose-Capillary: 158 mg/dL — ABNORMAL HIGH (ref 70–99)

## 2013-03-03 LAB — BASIC METABOLIC PANEL
BUN: 17 mg/dL (ref 6–23)
CO2: 26 mEq/L (ref 19–32)
Calcium: 9.4 mg/dL (ref 8.4–10.5)
Chloride: 101 mEq/L (ref 96–112)
Creatinine, Ser: 0.7 mg/dL (ref 0.50–1.10)
GFR calc Af Amer: >90 mL/min (ref >90)
GFR calc non Af Amer: 86 mL/min — ABNORMAL LOW (ref >90)
Glucose, Bld: 190 mg/dL — ABNORMAL HIGH (ref 70–99)
Potassium: 3.6 mEq/L (ref 3.5–5.1)
Sodium: 139 mEq/L (ref 135–145)

## 2013-03-03 MED ORDER — SPIRONOLACTONE 25 MG PO TABS
25.0000 mg | ORAL_TABLET | Freq: Every day | ORAL | Status: DC
  Administered 2013-03-03: 25 mg via ORAL
  Filled 2013-03-03 (×2): qty 1

## 2013-03-03 NOTE — Progress Notes (Signed)
Subjective:  Feeling better but complains of urinary frequency. Not short of breath today. Iand O's knows are inaccurate. Weight is up a little bit today.  Objective:  Vital Signs in the last 24 hours: BP 105/73  Pulse 102  Temp(Src) 97.6 F (36.4 C) (Oral)  Resp 20  Ht 5\' 2"  (1.575 m)  Wt 91.264 kg (201 lb 3.2 oz)  BMI 36.79 kg/m2  SpO2 95%  Physical Exam: Pleasant obese female in no acute distress Lungs:  Mild rales bases Cardiac:  Regular rhythm, normal S1 and S2,  soft S3 noted  Abdomen:  Soft, nontender, no masses Extremities:  1+ edema  present  Intake/Output from previous day: 09/27 0701 - 09/28 0700 In: 1280 [P.O.:1280] Out: 800 [Urine:800] Weight Filed Weights   03/01/13 1734 03/02/13 0524 03/03/13 0443  Weight: 91.3 kg (201 lb 4.5 oz) 90.447 kg (199 lb 6.4 oz) 91.264 kg (201 lb 3.2 oz)    Lab Results: Basic Metabolic Panel:  Recent Labs  45/40/98 0540 03/03/13 0530  NA 139 139  K 3.2* 3.6  CL 98 101  CO2 28 26  GLUCOSE 156* 190*  BUN 16 17  CREATININE 0.75 0.70    CBC:  Recent Labs  03/01/13 1255 03/01/13 1820  WBC 9.1 8.8  HGB 15.2* 14.7  HCT 43.7 41.7  MCV 91.8 92.3  PLT 278 237    BNP    Component Value Date/Time   PROBNP 1506.0* 03/01/2013 1300    Telemetry: Sinus rhythm with rate now of 98  Assessment/Plan:  1. Acute on chronic systolic heart failure 2. Cardiomyopathy 3. Tachycardia improved 4. Hypotension-better today  Recommendations:  Add spironolactone. Her blood pressure is better today and she is not is hypotensive. We'll keep n.p.o. tonight in case Dr. Anne Fu wants to proceed with catheterization as part of evaluation of cardiomyopathy.  Darden Palmer  MD [facility]  03/03/2013, 10:19 AM

## 2013-03-03 NOTE — Progress Notes (Signed)
Patient alert , oriented, no c/o pain, mild edema to L. Ankle, v/s  Stable  105/73, HR 102, no shortness of breath. Will continue to monitor patient. Burr Medico RN

## 2013-03-03 NOTE — ED Provider Notes (Signed)
History/physical exam/procedure(s) were performed by non-physician practitioner and as supervising physician I was immediately available for consultation/collaboration. I have reviewed all notes and am in agreement with care and plan.   Hilario Quarry, MD 03/03/13 [phone]

## 2013-03-04 DIAGNOSIS — I471 Supraventricular tachycardia: Secondary | ICD-10-CM

## 2013-03-04 DIAGNOSIS — I4719 Other supraventricular tachycardia: Secondary | ICD-10-CM

## 2013-03-04 DIAGNOSIS — E876 Hypokalemia: Secondary | ICD-10-CM | POA: Diagnosis present

## 2013-03-04 HISTORY — DX: Supraventricular tachycardia: I47.1

## 2013-03-04 HISTORY — DX: Hypokalemia: E87.6

## 2013-03-04 HISTORY — DX: Other supraventricular tachycardia: I47.19

## 2013-03-04 LAB — GLUCOSE, CAPILLARY: Glucose-Capillary: 170 mg/dL — ABNORMAL HIGH (ref 70–99)

## 2013-03-04 LAB — BASIC METABOLIC PANEL
BUN: 14 mg/dL (ref 6–23)
CO2: 26 mEq/L (ref 19–32)
Calcium: 9.2 mg/dL (ref 8.4–10.5)
Chloride: 98 mEq/L (ref 96–112)
Creatinine, Ser: 0.75 mg/dL (ref 0.50–1.10)
GFR calc Af Amer: >90 mL/min (ref >90)
GFR calc non Af Amer: 84 mL/min — ABNORMAL LOW (ref >90)
Glucose, Bld: 141 mg/dL — ABNORMAL HIGH (ref 70–99)
Potassium: 3.2 mEq/L — ABNORMAL LOW (ref 3.5–5.1)
Sodium: 140 mEq/L (ref 135–145)

## 2013-03-04 MED ORDER — CARVEDILOL 3.125 MG PO TABS: 3.1250 mg | ORAL_TABLET | Freq: Two times a day (BID) | ORAL | Status: DC

## 2013-03-04 MED ORDER — POTASSIUM CHLORIDE ER 10 MEQ PO TBCR
10.0000 meq | EXTENDED_RELEASE_TABLET | Freq: Every day | ORAL | Status: DC
  Filled 2013-03-04: qty 1

## 2013-03-04 MED ORDER — SPIRONOLACTONE 25 MG PO TABS: 25.0000 mg | ORAL_TABLET | Freq: Every day | ORAL | Status: DC

## 2013-03-04 MED ORDER — DIGOXIN 125 MCG PO TABS: 0.1250 mg | ORAL_TABLET | Freq: Every day | ORAL | Status: DC

## 2013-03-04 MED ORDER — FUROSEMIDE 40 MG PO TABS
40.0000 mg | ORAL_TABLET | Freq: Every day | ORAL | Status: DC
  Filled 2013-03-04: qty 1

## 2013-03-04 MED ORDER — POTASSIUM CHLORIDE CRYS ER 20 MEQ PO TBCR
20.0000 meq | EXTENDED_RELEASE_TABLET | Freq: Once | ORAL | Status: AC
  Administered 2013-03-04: 20 meq via ORAL
  Filled 2013-03-04: qty 1

## 2013-03-04 MED ORDER — LISINOPRIL 2.5 MG PO TABS: 2.5000 mg | ORAL_TABLET | Freq: Every day | ORAL | Status: DC

## 2013-03-04 MED ORDER — POTASSIUM CHLORIDE CRYS ER 20 MEQ PO TBCR: 40.0000 meq | EXTENDED_RELEASE_TABLET | Freq: Once | ORAL | Status: DC

## 2013-03-04 NOTE — Progress Notes (Signed)
Went over all discharge instructions with pt and husband. D/C 2 IV's d/c tele. Pt aware of follow up appts and when to call MD. Reviewed 2 gm Na diet & CHF teaching booklet along with daily weights.

## 2013-03-04 NOTE — Progress Notes (Signed)
Pt discharged per w/c with all belongings. Pt wanted to take all am meds when she gets home. MD stated she must get her PO potassium prior to D/C so she did take that. D/C per w/c per volunteer. Driven home in private vehicle per husband. Aware that her new meds have been sent electronically to [facility] and she will pick those up today.

## 2013-03-04 NOTE — Discharge Summary (Signed)
Physician Discharge Summary  Patient ID: Brittney Malone MRN: [id number] DOB/AGE: 70-Jan-1942 70 y.o.  Admit date: 03/01/2013 Discharge date: 03/04/2013  Admission Diagnoses:  Acute systolic heart failure, newly discovered cardiomyopathy-possibly tachycardia induced  Discharge Diagnoses:  Principal Problem:   Acute systolic HF (heart failure) Active Problems:   Type II or unspecified type diabetes mellitus without mention of complication, not stated as uncontrolled   Tachycardia   Hyperlipidemia   Hypothyroidism   History of pneumonia   Obesity   Hypokalemia   PAT (paroxysmal atrial tachycardia)   Discharged Condition: Fair  Hospital Course: HPI: 71 year-old female with no prior cardiovascular history, diabetes, hypertension, obesity who since May has been battling with various bouts of pneumonia, lower extremity edema, cough, shortness of breath, orthopnea. She has been treated with 3 separate rounds of antibiotics. She has had several chest x-rays as well as CT scan. Consolidation noted as well as pleural effusion. She has been sleeping in a recliner.  Today, she was in office for echocardiogram where her heart rate was noted to be in the 130 range. It appeared to be sinus tachycardia. Echo sonographer noted markedly reduced ejection fraction of 15-25%.  She does not smoke, never uses alcohol. Her thyroid condition is well controlled. No recent joint effusions or dysphagia. No recent bleeding.  Digoxin, low-dose beta blocker was started. Her tachycardia was improved in her average heart rate was less than 100 mostly in the 80s and 90s on telemetry. She was not having any chest discomfort. Her shortness of breath improved with Lasix 40 mg IV twice a day. Her total output was 1.7 L. Her weight was 90.1 kg.  Echocardiogram, personally reviewed, demonstrated severe cardiomyopathy/EF of 15-20%, global with moderate RV dysfunction as well.  BNP on admission was 1506. Free T4 was 1.93,  slightly above upper limits of normal however TSH was normal. AST and ALT were normal. Albumin was 3.2. Creatinine 0.75.  Her potassium was 3.2 on morning of discharge and potassium was repleted. She was also started on Aldactone 25 mg during this hospitalization. We will closely monitor her electrolytes.  We had a lengthy discussion on morning of discharge about prognosis, further cardiac studies, medical therapy, possible ICD in the future, risks of sudden cardiac death/dangerous arrhythmia. I discussed her case with colleagues as well. Like to try to control her tachycardia, which appears to be an atrial tachycardia and currently controlled upon discharge with heart rates in the 80s and 90s occasionally going into the low 100s with medications as described above. If this is a tachycardia induced cardiomyopathy, hopefully within the next few weeks, her ejection fraction will improve. I will repeat echocardiogram soon and clinic. If her ejection fraction does not improve, we will proceed with cardiac catheterization to exclude coronary artery disease. For what it is worth, she did not have any severe calcification on CT scan performed in August. She also has not had any anginal symptoms. She and her husband were present for discussion. We will have close followup.  Of course, this could be a viral cardiomyopathy. She believes that she had 3 bouts of pneumonia over the summer, certainly this could have been episodes of heart failure however. It was not until recently when she had lower extremity edema which was helped significantly by outpatient Lasix therapy initiated by Dr. Tiburcio Pea. BMET    Component Value Date/Time   NA 140 03/04/2013 0514   K 3.2* 03/04/2013 0514   CL 98 03/04/2013 0514   CO2 26  03/04/2013 0514   GLUCOSE 141* 03/04/2013 0514   BUN 14 03/04/2013 0514   CREATININE 0.75 03/04/2013 0514   CALCIUM 9.2 03/04/2013 0514   GFRNONAA 84* 03/04/2013 0514   GFRAA >90 03/04/2013 0514   BNP     Component Value Date/Time   PROBNP 1506.0* 03/01/2013 1300   CBC    Component Value Date/Time   WBC 8.8 03/01/2013 1820   RBC 4.52 03/01/2013 1820   HGB 14.7 03/01/2013 1820   HCT 41.7 03/01/2013 1820   PLT 237 03/01/2013 1820   MCV 92.3 03/01/2013 1820   MCH 32.5 03/01/2013 1820   MCHC 35.3 03/01/2013 1820   RDW 13.0 03/01/2013 1820   .  Discharge Exam: Blood pressure 94/65, pulse 90, temperature 97.5 F (36.4 C), temperature source Oral, resp. rate 18, height 5\' 2"  (1.575 m), weight 90.1 kg (198 lb 10.2 oz), SpO2 99.00%.  General: Alert and oriented x3 in no acute distress Cardiovascular: Regular rate and rhythm, soft systolic murmur Apex-mitral regurgitation seen on exam echocardiogram, no significant JVD appreciated Lungs: Clear to auscultation bilaterally mildly diminished bases Abdomen: Soft, nontender, overweight. Extremities: No significant edema normal distal pulses.  Disposition: 01-Home or Self Care  Discharge Orders   Future Appointments Provider Department Dept Phone   03/11/2013 2:00 PM Donato Schultz, MD [facility] [address] [phone]   Future Orders Complete By Expires   Diet - low sodium heart healthy  As directed    Increase activity slowly  As directed        Medication List    STOP taking these medications       ALEVE 220 MG tablet  Generic drug:  naproxen sodium      TAKE these medications       budesonide-formoterol 80-4.5 MCG/ACT inhaler  Commonly known as:  SYMBICORT  Inhale 2 puffs into the lungs 2 (two) times daily.     CALCIUM 600/VITAMIN D PO  Take 1 tablet by mouth daily.     carvedilol 3.125 MG tablet  Commonly known as:  COREG  Take 1 tablet (3.125 mg total) by mouth 2 (two) times daily with a meal.     cholecalciferol 1000 UNITS tablet  Commonly known as:  VITAMIN D  Take 1,000 Units by mouth daily.     CHOLEST OFF 450 MG Tabs  Generic drug:  Plant Sterols and Stanols  Take 2 tablets by mouth 2 (two) times daily.      digoxin 0.125 MG tablet  Commonly known as:  LANOXIN  Take 1 tablet (0.125 mg total) by mouth daily.     Fish Oil 1200 MG Caps  Take 1,200 mg by mouth 2 (two) times daily.     furosemide 20 MG tablet  Commonly known as:  LASIX  Take 20 mg by mouth 2 (two) times daily.     glipiZIDE-metformin 5-500 MG per tablet  Commonly known as:  METAGLIP  Take 2 tablets by mouth 2 (two) times daily before a meal.     KLOR-CON 10 10 MEQ tablet  Generic drug:  potassium chloride  Take 20 mEq by mouth daily.     levothyroxine 150 MCG tablet  Commonly known as:  SYNTHROID, LEVOTHROID  Take 150 mcg by mouth daily before breakfast.     lisinopril 2.5 MG tablet  Commonly known as:  PRINIVIL,ZESTRIL  Take 1 tablet (2.5 mg total) by mouth daily.     OSTEO BI-FLEX REGULAR STRENGTH PO  Take 1 tablet by mouth 2 (  two) times daily.     OVER THE COUNTER MEDICATION  Apply 1 application topically at bedtime. Physassist Foot Pain Cream     PENNSAID 1.5 % Soln  Generic drug:  Diclofenac Sodium  Place 40 drops onto the skin 2 (two) times daily.     spironolactone 25 MG tablet  Commonly known as:  ALDACTONE  Take 1 tablet (25 mg total) by mouth daily.           Follow-up Information   Follow up with Donato Schultz, MD On 03/11/2013. (2pm. [address] office. Blood work [employer]) as well. )    Specialty:  Cardiology   Contact information:   [address] [place] [address] [phone]      Greater than 35 minutes spent with patient, instructions, education, review of medical records, review of lab work, discussion with physician/nursing.  SignedDonato Schultz 03/04/2013, 8:26 AM

## 2013-03-07 NOTE — Progress Notes (Signed)
Utilization review completed. Aeva Posey, RN, BSN. 

## 2013-03-10 ENCOUNTER — Encounter: Payer: Self-pay | Admitting: Cardiology

## 2013-03-11 ENCOUNTER — Encounter: Payer: Self-pay | Admitting: Cardiology

## 2013-03-11 ENCOUNTER — Ambulatory Visit (INDEPENDENT_AMBULATORY_CARE_PROVIDER_SITE_OTHER): Payer: Medicare Other | Admitting: Cardiology

## 2013-03-11 ENCOUNTER — Other Ambulatory Visit: Payer: Medicare Other

## 2013-03-11 VITALS — BP 110/70 | HR 100 | Ht 62.0 in | Wt 193.0 lb

## 2013-03-11 DIAGNOSIS — I428 Other cardiomyopathies: Secondary | ICD-10-CM

## 2013-03-11 DIAGNOSIS — I420 Dilated cardiomyopathy: Secondary | ICD-10-CM | POA: Insufficient documentation

## 2013-03-11 DIAGNOSIS — I471 Supraventricular tachycardia: Secondary | ICD-10-CM

## 2013-03-11 DIAGNOSIS — I5022 Chronic systolic (congestive) heart failure: Secondary | ICD-10-CM | POA: Insufficient documentation

## 2013-03-11 DIAGNOSIS — E669 Obesity, unspecified: Secondary | ICD-10-CM

## 2013-03-11 DIAGNOSIS — E785 Hyperlipidemia, unspecified: Secondary | ICD-10-CM

## 2013-03-11 HISTORY — DX: Chronic systolic (congestive) heart failure: I50.22

## 2013-03-11 HISTORY — DX: Dilated cardiomyopathy: I42.0

## 2013-03-11 MED ORDER — CARVEDILOL 3.125 MG PO TABS: 6.2500 mg | ORAL_TABLET | Freq: Two times a day (BID) | ORAL | Status: DC

## 2013-03-11 MED ORDER — FUROSEMIDE 20 MG PO TABS: 40.0000 mg | ORAL_TABLET | Freq: Every morning | ORAL | Status: DC

## 2013-03-11 NOTE — Patient Instructions (Addendum)
Your physician has recommended you make the following change in your medication:   1.  Increase Carvedilol to 6.25mg  twice daily 2. Take Lasix 40 mg (2 tablets) in the morning.  Your physician has requested that you have an echocardiogram. Echocardiography is a painless test that uses sound waves to create images of your heart. It provides your doctor with information about the size and shape of your heart and how well your heart's chambers and valves are working. This procedure takes approximately one hour. There are no restrictions for this procedure. Schedule in 3 weeks with office visit afterwards.  Your physician recommends that you schedule a follow-up appointment in: 3 weeks same day as echo.

## 2013-03-11 NOTE — Progress Notes (Signed)
[address] [place], [place]  [address] Phone: [phone] Fax:  [phone]  Date:  03/11/2013   ID:  Brittney Malone, DOB 08-May-1941, MRN [id number]  PCP:  Carver Fila, RN   History of Present Illness: Brittney Malone is a 72 y.o. female with recently discovered cardiomyopathy, possible tachycardia induced versus viral, with hospitalization end of September 2014 here for followup.  Discharge summary reviewed. She has been having bouts of pneumonia over the summer, multiple rounds of antibiotics. Trouble with orthopnea. Tachycardia noted, Lasix initiated by Dr. Tiburcio Pea with good results. She went for echocardiogram as an outpatient and was noted to have a severe cardiomyopathy with ejection fraction of 15%. She was therefore admitted to the hospital for further diuresis and treatment.  While in the hospital, digoxin 125 mcg as well as low dose carvedilol 3.125 mg twice a day was initiated.  Has been breathing a lot better she states. Walking. No longer sleeping in recliner.    Wt Readings from Last 3 Encounters:  03/11/13 193 lb (87.544 kg)  03/04/13 198 lb 10.2 oz (90.1 kg)  02/02/12 206 lb 5.6 oz (93.6 kg)     Past Medical History  Diagnosis Date  . Hypertension   . History of nuclear stress test     told results were WNL, - several yrs. ago    . Hypothyroidism   . Diabetes mellitus   . Neuromuscular disorder     neuropathy- feet- bilateral   . Arthritis     knees- OA, uses topical treatment   . Restless leg syndrome   . Osteoarthritis     Past Surgical History  Procedure Laterality Date  . Abdominal hysterectomy    . Eye surgery      cataracts (bilateral) - removed- /w IOL   . Tonsillectomy      as a youngster  . Appendectomy    . Maxillary antrostomy  02/02/2012    Procedure: MAXILLARY ANTROSTOMY;  Surgeon: [name], MD;  Location: [facility] OR;  Service: ENT;  Laterality: Left;  Left Maxillary Antrostomy [id number]  . Sphenoidectomy   02/02/2012    Procedure: SPHENOIDECTOMY;  Surgeon: [name], MD;  Location: [facility] OR;  Service: ENT;  Laterality: Left;  Left Sphenoidectomy [phone]  . Nasal sinus surgery  02/02/2012    Procedure: ENDOSCOPIC SINUS SURGERY WITH STEALTH;  Surgeon: [name], MD;  Location: [facility] OR;  Service: ENT;  Laterality: Left;  Left Frontal Sinusotomy [id number], and endoscopic resection of left anterior cranial fossa tumor [id number]  . Ethmoidectomy  02/02/2012    Procedure: ETHMOIDECTOMY;  Surgeon: [name], MD;  Location: [facility] OR;  Service: ENT;  Laterality: Left;  Left Ethmoidectomy [phone] with repair of CSF leak  . Carpal tunnel repair    . Knee cartilage surgery Left   . Cataract extraction, bilateral    . Other      Hemangiopericytoma, left nasal septum,repair of CSF leak    Current Outpatient Prescriptions  Medication Sig Dispense Refill  . albuterol (PROVENTIL HFA;VENTOLIN HFA) 108 (90 BASE) MCG/ACT inhaler Inhale 2 puffs into the lungs every 4 (four) hours as needed for wheezing.      . budesonide-formoterol (SYMBICORT) 80-4.5 MCG/ACT inhaler Inhale 2 puffs into the lungs 2 (two) times daily.      . Calcium Carbonate-Vitamin D (CALCIUM 600/VITAMIN D PO) Take 1 tablet by mouth daily.      . carvedilol (COREG) 3.125 MG tablet Take 3.125 mg  by mouth 2 (two) times daily with a meal.      . Cholecalciferol (VITAMIN D-3 PO) Take by mouth. TAKE 1 TAB DAILY      . digoxin (LANOXIN) 0.125 MG tablet Take 0.125 mg by mouth daily.      . furosemide (LASIX) 20 MG tablet 1 TAB TWICE A DAY      . glipiZIDE-metformin (METAGLIP) 5-500 MG per tablet Take 1 tablet by mouth 2 (two) times daily before a meal.       . KLOR-CON 10 10 MEQ tablet Take 20 mEq by mouth daily.      [name], [name]      . lisinopril (PRINIVIL,ZESTRIL) 2.5 MG tablet Take 2.5 mg by mouth daily.      . Omega-3 Fatty Acids (FISH OIL) 1200 MG CAPS Take 1,200 mg by  mouth 2 (two) times daily.      . Plant Sterols and Stanols (CHOLEST OFF) 450 MG TABS Take by mouth 2 (two) times daily.       Marland Kitchen spironolactone (ALDACTONE) 25 MG tablet Take 25 mg by mouth daily.       No current facility-administered medications for this visit.    Allergies:    Allergies  Allergen Reactions  . Aspirin Other (See Comments)    GI distress  . Augmentin [Amoxicillin-Pot Clavulanate]   . Erythromycin Nausea And Vomiting  . Levaquin [Levofloxacin In D5w] Diarrhea    Social History:  The patient  reports that she has never smoked. She does not have any smokeless tobacco history on file. She reports that she does not drink alcohol or use illicit drugs.   ROS:  Please see the history of present illness.   Denies any syncope, bleeding, orthopnea, PND    PHYSICAL EXAM: VS:  BP 110/70  Pulse 100  Ht 5\' 2"  (1.575 m)  Wt 193 lb (87.544 kg)  BMI 35.29 kg/m2 Well nourished, well developed, in no acute distress HEENT: normal Neck: no JVD Cardiac:  normal S1, S2; RRR; no murmur Lungs:  clear to auscultation bilaterally, no wheezing, rhonchi or rales Abd: soft, nontender, no hepatomegaly Ext: no edema Skin: warm and dry Neuro: no focal abnormalities noted  EKG:     03/03/13-normal sinus rhythm, 92 Echocardiogram: 03/01/13-ejection fraction 15%  ASSESSMENT AND PLAN:  1. Newly discovered cardiomyopathy/chronic systolic heart failure - hopefully with resolution of tachycardia, we will improve her overall cardiac function. We will check echocardiogram in 3 weeks. If ejection fraction still severely decreased, we will proceed with cardiac catheterization. Risks and benefits of cardiac catheterization have been discussed today at length. Risks of stroke, death, MI, bleeding discussed. Preferential radial artery approach. I'm also fine with her decreasing her Lasix to 40 mg once a day and closely monitoring her weight. She appears to be euvolemic. I explained to her methodology of  proceeding with heart catheterization. If her ejection fraction returned to normal after resolution of tachycardia, we may not need this procedure. I also explained to her and her husband and increased risks of sudden death with cardiomyopathy.  She did not have any significant coronary calcifications noted on non-gated CT. 2. Atrial tachycardia-improved with digoxin and beta blocker. Will increase coreg to 6.25mg  BID. She is currently on lisinopril low dose. 3. Obesity-continue to encourage weight loss. She has lost weight with her overall diuresis. 4. We will see her back very shortly in 3 weeks. I would like  to see her after her echocardiogram.  Signed, Donato Schultz, MD [facility]  03/11/2013 2:23 PM

## 2013-03-12 ENCOUNTER — Other Ambulatory Visit: Payer: Self-pay

## 2013-03-12 DIAGNOSIS — Z1231 Encounter for screening mammogram for malignant neoplasm of breast: Secondary | ICD-10-CM

## 2013-04-01 ENCOUNTER — Ambulatory Visit (HOSPITAL_BASED_OUTPATIENT_CLINIC_OR_DEPARTMENT_OTHER): Payer: Medicare Other | Admitting: Cardiology

## 2013-04-01 ENCOUNTER — Encounter (HOSPITAL_COMMUNITY): Payer: Self-pay | Admitting: Pharmacy Technician

## 2013-04-01 ENCOUNTER — Encounter: Payer: Self-pay | Admitting: Cardiology

## 2013-04-01 ENCOUNTER — Ambulatory Visit (INDEPENDENT_AMBULATORY_CARE_PROVIDER_SITE_OTHER): Payer: Medicare Other | Admitting: Cardiology

## 2013-04-01 ENCOUNTER — Other Ambulatory Visit: Payer: Self-pay | Admitting: Cardiology

## 2013-04-01 VITALS — BP 120/76 | HR 88 | Ht 62.0 in | Wt 191.4 lb

## 2013-04-01 DIAGNOSIS — E119 Type 2 diabetes mellitus without complications: Secondary | ICD-10-CM | POA: Insufficient documentation

## 2013-04-01 DIAGNOSIS — E669 Obesity, unspecified: Secondary | ICD-10-CM | POA: Insufficient documentation

## 2013-04-01 DIAGNOSIS — I420 Dilated cardiomyopathy: Secondary | ICD-10-CM

## 2013-04-01 DIAGNOSIS — I428 Other cardiomyopathies: Secondary | ICD-10-CM | POA: Insufficient documentation

## 2013-04-01 DIAGNOSIS — I5021 Acute systolic (congestive) heart failure: Secondary | ICD-10-CM

## 2013-04-01 DIAGNOSIS — I509 Heart failure, unspecified: Secondary | ICD-10-CM | POA: Insufficient documentation

## 2013-04-01 DIAGNOSIS — I5022 Chronic systolic (congestive) heart failure: Secondary | ICD-10-CM

## 2013-04-01 DIAGNOSIS — I471 Supraventricular tachycardia, unspecified: Secondary | ICD-10-CM

## 2013-04-01 DIAGNOSIS — I059 Rheumatic mitral valve disease, unspecified: Secondary | ICD-10-CM | POA: Insufficient documentation

## 2013-04-01 DIAGNOSIS — E785 Hyperlipidemia, unspecified: Secondary | ICD-10-CM | POA: Insufficient documentation

## 2013-04-01 LAB — CBC WITH DIFFERENTIAL/PLATELET
Basophils Absolute: 0 10*3/uL (ref 0.0–0.1)
Basophils Relative: 0.4 % (ref 0.0–3.0)
Eosinophils Absolute: 0.1 10*3/uL (ref 0.0–0.7)
Eosinophils Relative: 1 % (ref 0.0–5.0)
HCT: 41.6 % (ref 36.0–46.0)
Hemoglobin: 14.4 g/dL (ref 12.0–15.0)
Lymphocytes Relative: 19.2 % (ref 12.0–46.0)
Lymphs Abs: 1.6 10*3/uL (ref 0.7–4.0)
MCHC: 34.5 g/dL (ref 30.0–36.0)
MCV: 91.8 fl (ref 78.0–100.0)
Monocytes Absolute: 0.9 10*3/uL (ref 0.1–1.0)
Monocytes Relative: 11.2 % (ref 3.0–12.0)
Neutro Abs: 5.7 10*3/uL (ref 1.4–7.7)
Neutrophils Relative %: 68.2 % (ref 43.0–77.0)
Platelets: 228 10*3/uL (ref 150.0–400.0)
RBC: 4.53 Mil/uL (ref 3.87–5.11)
RDW: 13.1 % (ref 11.5–14.6)
WBC: 8.4 10*3/uL (ref 4.5–10.5)

## 2013-04-01 LAB — BASIC METABOLIC PANEL
BUN: 12 mg/dL (ref 6–23)
CO2: 30 mEq/L (ref 19–32)
Calcium: 9.6 mg/dL (ref 8.4–10.5)
Chloride: 95 mEq/L — ABNORMAL LOW (ref 96–112)
Creatinine, Ser: 0.8 mg/dL (ref 0.4–1.2)
GFR: 78.64 mL/min (ref >60.00)
Glucose, Bld: 197 mg/dL — ABNORMAL HIGH (ref 70–99)
Potassium: 3.8 mEq/L (ref 3.5–5.1)
Sodium: 137 mEq/L (ref 135–145)

## 2013-04-01 LAB — PROTIME-INR
INR: 1.2 ratio — ABNORMAL HIGH (ref 0.8–1.0)
Prothrombin Time: 12.2 s (ref 10.2–12.4)

## 2013-04-01 NOTE — Progress Notes (Signed)
    [address] [place], [place]  [address] Phone: [phone] Fax:  [phone]  Date:  04/01/2013   ID:  Brittney Malone, DOB 07/28/1940, MRN [id number]  PCP:  Harris, Ronnie Thomas, RN   History of Present Illness: Brittney Malone is a 72 y.o. female with newly diagnosed dilated cardiomyopathy September of 2014 after several months of shortness of breath, possible pneumonia, possibly tachycardia induced.  She had significant orthopnea and edema at time of diagnosis. Her heart rate was noted to be in the 130 range and appeared to be sinus tachycardia. EF originally 15-25%.  In the hospital setting, discharged on 03/04/13, digoxin, low-dose beta blocker was started. Her tachycardia improved mostly in the 80s and 90s on telemetry. She has had moderate RV dysfunction as well.  BNP on admission was 1506. Creatinine 0.75.  She was also started on Aldactone 25 mg during hospitalization.   Wt Readings from Last 3 Encounters:  04/01/13 191 lb 6.4 oz (86.818 kg)  03/11/13 193 lb (87.544 kg)  03/04/13 198 lb 10.2 oz (90.1 kg)     Past Medical History  Diagnosis Date  . Hypertension   . History of nuclear stress test     told results were WNL, - several yrs. ago    . Hypothyroidism   . Diabetes mellitus   . Neuromuscular disorder     neuropathy- feet- bilateral   . Arthritis     knees- OA, uses topical treatment   . Restless leg syndrome   . Osteoarthritis     Past Surgical History  Procedure Laterality Date  . Abdominal hysterectomy    . Eye surgery      cataracts (bilateral) - removed- /w IOL   . Tonsillectomy      as a youngster  . Appendectomy    . Maxillary antrostomy  02/02/2012    Procedure: MAXILLARY ANTROSTOMY;  Surgeon: Mitchell Gore, MD;  Location: [facility] OR;  Service: ENT;  Laterality: Left;  Left Maxillary Antrostomy [id number]  . Sphenoidectomy  02/02/2012    Procedure: SPHENOIDECTOMY;  Surgeon: Mitchell Gore, MD;  Location: [facility] OR;  Service: ENT;   Laterality: Left;  Left Sphenoidectomy 31255  . Nasal sinus surgery  02/02/2012    Procedure: ENDOSCOPIC SINUS SURGERY WITH STEALTH;  Surgeon: Mitchell Gore, MD;  Location: [facility] OR;  Service: ENT;  Laterality: Left;  Left Frontal Sinusotomy 312676, and endoscopic resection of left anterior cranial fossa tumor [id number]  . Ethmoidectomy  02/02/2012    Procedure: ETHMOIDECTOMY;  Surgeon: Mitchell Gore, MD;  Location: [facility] OR;  Service: ENT;  Laterality: Left;  Left Ethmoidectomy 31255 with repair of CSF leak  . Carpal tunnel repair    . Knee cartilage surgery Left   . Cataract extraction, bilateral    . Other      Hemangiopericytoma, left nasal septum,repair of CSF leak    Current Outpatient Prescriptions  Medication Sig Dispense Refill  . albuterol (PROVENTIL HFA;VENTOLIN HFA) 108 (90 BASE) MCG/ACT inhaler Inhale 2 puffs into the lungs every 4 (four) hours as needed for wheezing.      . budesonide-formoterol (SYMBICORT) 80-4.5 MCG/ACT inhaler Inhale 2 puffs into the lungs 2 (two) times daily.      . Calcium Carbonate-Vitamin D (CALCIUM 600/VITAMIN D PO) Take 1 tablet by mouth daily.      . carvedilol (COREG) 3.125 MG tablet Take 2 tablets (6.25 mg total) by mouth 2 (two) times daily with a meal.  60 tablet    5  . Cholecalciferol (VITAMIN D-3 PO) Take by mouth. TAKE 1 TAB DAILY      . digoxin (LANOXIN) 0.125 MG tablet Take 0.125 mg by mouth daily.      . furosemide (LASIX) 20 MG tablet Take 40 mg by mouth daily.      . glipiZIDE-metformin (METAGLIP) 5-500 MG per tablet Take 1 tablet by mouth 2 (two) times daily before a meal.       . KLOR-CON 10 10 MEQ tablet Take 20 mEq by mouth daily.      . levothyroxine (SYNTHROID, [name]      . lisinopril (PRINIVIL,ZESTRIL) 2.5 MG tablet Take 2.5 mg by mouth daily.      . Omega-3 Fatty Acids (FISH OIL) 1200 MG CAPS Take 1,200 mg by mouth 2 (two) times daily.      . Plant Sterols and Stanols (CHOLEST  OFF) 450 MG TABS Take by mouth 2 (two) times daily.       . spironolactone (ALDACTONE) 25 MG tablet Take 25 mg by mouth daily.       No current facility-administered medications for this visit.    Allergies:    Allergies  Allergen Reactions  . Aspirin Other (See Comments)    GI distress  . Augmentin [Amoxicillin-Pot Clavulanate]   . Erythromycin Nausea And Vomiting  . Levaquin [Levofloxacin In D5w] Diarrhea    Social History:  The patient  reports that she has never smoked. She does not have any smokeless tobacco history on file. She reports that she does not drink alcohol or use illicit drugs.   ROS:  Please see the history of present illness.   Denies any fevers, chills, worsening orthopnea, PND, chest pain, syncope, significant palpitations    PHYSICAL EXAM: VS:  BP 120/76  Pulse 88  Ht 5' 2" (1.575 m)  Wt 191 lb 6.4 oz (86.818 kg)  BMI 35 kg/m2 Well nourished, well developed, in no acute distress HEENT: normal Neck: no JVD Cardiac:  normal S1, S2; RRR; no murmur Lungs:  clear to auscultation bilaterally, no wheezing, rhonchi or rales Abd: soft, nontender, no hepatomegaly Ext: no edema Skin: warm and dry Neuro: no focal abnormalities noted     Echocardiogram 03/01/13-ejection fraction 15%, moderate RV dilatation Echocardiogram 04/01/13-EF 20-25% (on medication, rate controlled)  ASSESSMENT AND PLAN:  1. Dilated cardiomyopathy/acute systolic heart failure-possible tachycardia induced, possible viral. Hospitalization 9/14. Medications reviewed. With Aldactone, we'll check basic metabolic profile. Repeat echocardiogram today shows only minimal improvement in ejection fraction up to 20-25% from 15%. Because of this, we will pursue cardiac catheterization to exclude coronary artery disease. Radial approach preferred. Risks and benefits of procedure including stroke, heart attack, death, renal impairment, bleeding have been discussed. Ample time for questioning. Continue with  low-dose beta blocker, ACE inhibitor. We will try to up titrate this following catheterization. Continue with digoxin for heart rate control. Aldactone. Checking potassium and creatinine. I do not want increase her ACE inhibitor currently because of future contrast exposure.  Signed, Kambrie Eddleman, MD FACC  04/01/2013 10:56 AM    

## 2013-04-01 NOTE — Patient Instructions (Signed)
Your physician has requested that you have a cardiac catheterization. Cardiac catheterization is used to diagnose and/or treat various heart conditions. Doctors may recommend this procedure for a number of different reasons. The most common reason is to evaluate chest pain. Chest pain can be a symptom of coronary artery disease (CAD), and cardiac catheterization can show whether plaque is narrowing or blocking your heart's arteries. This procedure is also used to evaluate the valves, as well as measure the blood flow and oxygen levels in different parts of your heart. For further information please visit [email or web address] Please follow instruction sheet, as given.  Your physician recommends that you return for lab work today for pre-cath: Bmet, CBC with Diff and Bmet.

## 2013-04-01 NOTE — Progress Notes (Signed)
Echo performed. 

## 2013-04-02 ENCOUNTER — Encounter (HOSPITAL_COMMUNITY)
Admission: RE | Disposition: A | Discharge status: Home / Self Care | Payer: Self-pay | Source: Ambulatory Visit | Attending: Cardiology

## 2013-04-02 ENCOUNTER — Ambulatory Visit (HOSPITAL_COMMUNITY)
Admission: RE | Admit: 2013-04-02 | Discharge: 2013-04-02 | Disposition: A | Discharge status: Home / Self Care | Payer: Medicare Other | Source: Ambulatory Visit | Attending: Cardiology | Admitting: Cardiology

## 2013-04-02 DIAGNOSIS — I4719 Other supraventricular tachycardia: Secondary | ICD-10-CM | POA: Diagnosis present

## 2013-04-02 DIAGNOSIS — I471 Supraventricular tachycardia: Secondary | ICD-10-CM | POA: Diagnosis present

## 2013-04-02 DIAGNOSIS — M1710 Unilateral primary osteoarthritis, unspecified knee: Secondary | ICD-10-CM | POA: Insufficient documentation

## 2013-04-02 DIAGNOSIS — I420 Dilated cardiomyopathy: Secondary | ICD-10-CM | POA: Diagnosis present

## 2013-04-02 DIAGNOSIS — I10 Essential (primary) hypertension: Secondary | ICD-10-CM | POA: Insufficient documentation

## 2013-04-02 DIAGNOSIS — Z79899 Other long term (current) drug therapy: Secondary | ICD-10-CM | POA: Insufficient documentation

## 2013-04-02 DIAGNOSIS — E039 Hypothyroidism, unspecified: Secondary | ICD-10-CM | POA: Insufficient documentation

## 2013-04-02 DIAGNOSIS — G2581 Restless legs syndrome: Secondary | ICD-10-CM | POA: Insufficient documentation

## 2013-04-02 DIAGNOSIS — I428 Other cardiomyopathies: Secondary | ICD-10-CM | POA: Insufficient documentation

## 2013-04-02 DIAGNOSIS — M1990 Unspecified osteoarthritis, unspecified site: Secondary | ICD-10-CM | POA: Insufficient documentation

## 2013-04-02 DIAGNOSIS — E119 Type 2 diabetes mellitus without complications: Secondary | ICD-10-CM | POA: Insufficient documentation

## 2013-04-02 DIAGNOSIS — I5021 Acute systolic (congestive) heart failure: Secondary | ICD-10-CM | POA: Diagnosis present

## 2013-04-02 DIAGNOSIS — IMO0002 Reserved for concepts with insufficient information to code with codable children: Secondary | ICD-10-CM | POA: Insufficient documentation

## 2013-04-02 HISTORY — PX: LEFT HEART CATHETERIZATION WITH CORONARY ANGIOGRAM: SHX5451

## 2013-04-02 LAB — GLUCOSE, CAPILLARY
Glucose-Capillary: 197 mg/dL — ABNORMAL HIGH (ref 70–99)
Glucose-Capillary: 165 mg/dL — ABNORMAL HIGH (ref 70–99)

## 2013-04-02 SURGERY — LEFT HEART CATHETERIZATION WITH CORONARY ANGIOGRAM
Anesthesia: LOCAL

## 2013-04-02 MED ORDER — VERAPAMIL HCL 2.5 MG/ML IV SOLN
INTRAVENOUS | Status: AC
  Filled 2013-04-02: qty 2

## 2013-04-02 MED ORDER — SODIUM CHLORIDE 0.9 % IV SOLN: 1.0000 mL/kg/h | INTRAVENOUS | Status: DC

## 2013-04-02 MED ORDER — MIDAZOLAM HCL 2 MG/2ML IJ SOLN
INTRAMUSCULAR | Status: AC
  Filled 2013-04-02: qty 2

## 2013-04-02 MED ORDER — HEPARIN SODIUM (PORCINE) 1000 UNIT/ML IJ SOLN
INTRAMUSCULAR | Status: AC
  Filled 2013-04-02: qty 1

## 2013-04-02 MED ORDER — SODIUM CHLORIDE 0.9 % IJ SOLN: 3.0000 mL | Freq: Two times a day (BID) | INTRAMUSCULAR | Status: DC

## 2013-04-02 MED ORDER — FUROSEMIDE 20 MG PO TABS: 40.0000 mg | ORAL_TABLET | ORAL | Status: DC

## 2013-04-02 MED ORDER — DIAZEPAM 5 MG PO TABS
ORAL_TABLET | ORAL | Status: AC
  Administered 2013-04-02: 5 mg
  Filled 2013-04-02: qty 1

## 2013-04-02 MED ORDER — ONDANSETRON HCL 4 MG/2ML IJ SOLN: 4.0000 mg | Freq: Four times a day (QID) | INTRAMUSCULAR | Status: DC | PRN

## 2013-04-02 MED ORDER — LIDOCAINE HCL (PF) 1 % IJ SOLN
INTRAMUSCULAR | Status: AC
  Filled 2013-04-02: qty 30

## 2013-04-02 MED ORDER — ASPIRIN 81 MG PO CHEW: 81.0000 mg | CHEWABLE_TABLET | ORAL | Status: DC

## 2013-04-02 MED ORDER — DIAZEPAM 5 MG PO TABS: 5.0000 mg | ORAL_TABLET | ORAL | Status: DC

## 2013-04-02 MED ORDER — SODIUM CHLORIDE 0.9 % IJ SOLN: 3.0000 mL | INTRAMUSCULAR | Status: DC | PRN

## 2013-04-02 MED ORDER — NITROGLYCERIN 0.2 MG/ML ON CALL CATH LAB
INTRAVENOUS | Status: AC
  Filled 2013-04-02: qty 1

## 2013-04-02 MED ORDER — SODIUM CHLORIDE 0.9 % IV SOLN: 250.0000 mL | INTRAVENOUS | Status: DC | PRN

## 2013-04-02 MED ORDER — ACETAMINOPHEN 325 MG PO TABS: 650.0000 mg | ORAL_TABLET | ORAL | Status: DC | PRN

## 2013-04-02 MED ORDER — ASPIRIN 81 MG PO CHEW
CHEWABLE_TABLET | ORAL | Status: AC
  Administered 2013-04-02: 81 mg
  Filled 2013-04-02: qty 1

## 2013-04-02 MED ORDER — FENTANYL CITRATE 0.05 MG/ML IJ SOLN
INTRAMUSCULAR | Status: AC
  Filled 2013-04-02: qty 2

## 2013-04-02 MED ORDER — SODIUM CHLORIDE 0.9 % IV SOLN
INTRAVENOUS | Status: DC
  Administered 2013-04-02: 11:00:00 via INTRAVENOUS

## 2013-04-02 MED ORDER — HEPARIN (PORCINE) IN NACL 2-0.9 UNIT/ML-% IJ SOLN
INTRAMUSCULAR | Status: AC
  Filled 2013-04-02: qty 1000

## 2013-04-02 NOTE — H&P (View-Only) (Signed)
[address] [place], [place]  [address] Phone: [phone] Fax:  [phone]  Date:  04/01/2013   ID:  Brittney Malone, DOB [birth year], MRN [id number]  PCP:  Carver Fila, RN   History of Present Illness: Brittney Malone is a 72 y.o. female with newly diagnosed dilated cardiomyopathy September of 2014 after several months of shortness of breath, possible pneumonia, possibly tachycardia induced.  She had significant orthopnea and edema at time of diagnosis. Her heart rate was noted to be in the 130 range and appeared to be sinus tachycardia. EF originally 15-25%.  In the hospital setting, discharged on 03/04/13, digoxin, low-dose beta blocker was started. Her tachycardia improved mostly in the 80s and 90s on telemetry. She has had moderate RV dysfunction as well.  BNP on admission was 1506. Creatinine 0.75.  She was also started on Aldactone 25 mg during hospitalization.   Wt Readings from Last 3 Encounters:  04/01/13 191 lb 6.4 oz (86.818 kg)  03/11/13 193 lb (87.544 kg)  03/04/13 198 lb 10.2 oz (90.1 kg)     Past Medical History  Diagnosis Date  . Hypertension   . History of nuclear stress test     told results were WNL, - several yrs. ago    . Hypothyroidism   . Diabetes mellitus   . Neuromuscular disorder     neuropathy- feet- bilateral   . Arthritis     knees- OA, uses topical treatment   . Restless leg syndrome   . Osteoarthritis     Past Surgical History  Procedure Laterality Date  . Abdominal hysterectomy    . Eye surgery      cataracts (bilateral) - removed- /w IOL   . Tonsillectomy      as a youngster  . Appendectomy    . Maxillary antrostomy  02/02/2012    Procedure: MAXILLARY ANTROSTOMY;  Surgeon: [name], MD;  Location: [facility] OR;  Service: ENT;  Laterality: Left;  Left Maxillary Antrostomy [id number]  . Sphenoidectomy  02/02/2012    Procedure: SPHENOIDECTOMY;  Surgeon: [name], MD;  Location: [facility] OR;  Service: ENT;   Laterality: Left;  Left Sphenoidectomy [phone]  . Nasal sinus surgery  02/02/2012    Procedure: ENDOSCOPIC SINUS SURGERY WITH STEALTH;  Surgeon: [name], MD;  Location: [facility] OR;  Service: ENT;  Laterality: Left;  Left Frontal Sinusotomy [id number], and endoscopic resection of left anterior cranial fossa tumor [id number]  . Ethmoidectomy  02/02/2012    Procedure: ETHMOIDECTOMY;  Surgeon: [name], MD;  Location: [facility] OR;  Service: ENT;  Laterality: Left;  Left Ethmoidectomy [phone] with repair of CSF leak  . Carpal tunnel repair    . Knee cartilage surgery Left   . Cataract extraction, bilateral    . Other      Hemangiopericytoma, left nasal septum,repair of CSF leak    Current Outpatient Prescriptions  Medication Sig Dispense Refill  . albuterol (PROVENTIL HFA;VENTOLIN HFA) 108 (90 BASE) MCG/ACT inhaler Inhale 2 puffs into the lungs every 4 (four) hours as needed for wheezing.      . budesonide-formoterol (SYMBICORT) 80-4.5 MCG/ACT inhaler Inhale 2 puffs into the lungs 2 (two) times daily.      . Calcium Carbonate-Vitamin D (CALCIUM 600/VITAMIN D PO) Take 1 tablet by mouth daily.      . carvedilol (COREG) 3.125 MG tablet Take 2 tablets (6.25 mg total) by mouth 2 (two) times daily with a meal.  60 tablet  5  . Cholecalciferol (VITAMIN D-3 PO) Take by mouth. TAKE 1 TAB DAILY      . digoxin (LANOXIN) 0.125 MG tablet Take 0.125 mg by mouth daily.      . furosemide (LASIX) 20 MG tablet Take 40 mg by mouth daily.      Marland Kitchen glipiZIDE-metformin (METAGLIP) 5-500 MG per tablet Take 1 tablet by mouth 2 (two) times daily before a meal.       . KLOR-CON 10 10 MEQ tablet Take 20 mEq by mouth daily.      [name], [name]      . lisinopril (PRINIVIL,ZESTRIL) 2.5 MG tablet Take 2.5 mg by mouth daily.      . Omega-3 Fatty Acids (FISH OIL) 1200 MG CAPS Take 1,200 mg by mouth 2 (two) times daily.      . Plant Sterols and Stanols (CHOLEST  OFF) 450 MG TABS Take by mouth 2 (two) times daily.       Marland Kitchen spironolactone (ALDACTONE) 25 MG tablet Take 25 mg by mouth daily.       No current facility-administered medications for this visit.    Allergies:    Allergies  Allergen Reactions  . Aspirin Other (See Comments)    GI distress  . Augmentin [Amoxicillin-Pot Clavulanate]   . Erythromycin Nausea And Vomiting  . Levaquin [Levofloxacin In D5w] Diarrhea    Social History:  The patient  reports that she has never smoked. She does not have any smokeless tobacco history on file. She reports that she does not drink alcohol or use illicit drugs.   ROS:  Please see the history of present illness.   Denies any fevers, chills, worsening orthopnea, PND, chest pain, syncope, significant palpitations    PHYSICAL EXAM: VS:  BP 120/76  Pulse 88  Ht 5\' 2"  (1.575 m)  Wt 191 lb 6.4 oz (86.818 kg)  BMI 35 kg/m2 Well nourished, well developed, in no acute distress HEENT: normal Neck: no JVD Cardiac:  normal S1, S2; RRR; no murmur Lungs:  clear to auscultation bilaterally, no wheezing, rhonchi or rales Abd: soft, nontender, no hepatomegaly Ext: no edema Skin: warm and dry Neuro: no focal abnormalities noted     Echocardiogram 03/01/13-ejection fraction 15%, moderate RV dilatation Echocardiogram 04/01/13-EF 20-25% (on medication, rate controlled)  ASSESSMENT AND PLAN:  1. Dilated cardiomyopathy/acute systolic heart failure-possible tachycardia induced, possible viral. Hospitalization 9/14. Medications reviewed. With Aldactone, we'll check basic metabolic profile. Repeat echocardiogram today shows only minimal improvement in ejection fraction up to 20-25% from 15%. Because of this, we will pursue cardiac catheterization to exclude coronary artery disease. Radial approach preferred. Risks and benefits of procedure including stroke, heart attack, death, renal impairment, bleeding have been discussed. Ample time for questioning. Continue with  low-dose beta blocker, ACE inhibitor. We will try to up titrate this following catheterization. Continue with digoxin for heart rate control. Aldactone. Checking potassium and creatinine. I do not want increase her ACE inhibitor currently because of future contrast exposure.  Signed, Donato Schultz, MD [facility]  04/01/2013 10:56 AM

## 2013-04-02 NOTE — CV Procedure (Signed)
    CARDIAC CATHETERIZATION  PROCEDURE:  Left heart catheterization with selective coronary angiography, left ventriculogram via the radial artery approach.  INDICATIONS:  72 year-old female with newly diagnosed dilated cardiomyopathy/acute systolic heart failure of unknown etiology. Previous bouts of "pneumonia "several months ago. Ejection fraction originally 15-20% on echocardiogram, improving to 25%. Cardiac catheterization is being performed to exclude the possibility of ischemic cardiomyopathy.  The risks, benefits, and details of the procedure were explained to the patient, including possibilities of stroke, heart attack, death, renal impairment, arterial damage, bleeding.  The patient verbalized understanding and wanted to proceed.  Informed written consent was obtained.  PROCEDURE TECHNIQUE:  Allen's test was performed pre-and post procedure and was normal. The right radial artery site was prepped and draped in a sterile fashion. One percent lidocaine was used for local anesthesia. Using the modified Seldinger technique a 5 French hydrophilic sheath was inserted into the radial artery without difficulty. 3 mg of verapamil was administered via the sheath. A Judkins right #4 catheter with the guidance of a Versicore wire was placed in the right coronary cusp and selectively cannulated the right coronary artery. After traversing the aortic arch, 3000 units of heparin IV was administered. A Judkins left #3.5 catheter was used to selectively cannulate the left main artery. Multiple views with hand injection of Omnipaque were obtained. Catheter a pigtail catheter was used to cross into the left ventricle, hemodynamics were obtained, and a left ventriculogram was performed in the RAO position with power injection. Following the procedure, sheath was removed, patient was hemodynamically stable, hemostasis was maintained with a Terumo T band.   CONTRAST:  Total of 55 ml.    FLOUROSCOPY TIME: 3.4  min.  COMPLICATIONS:  None.    HEMODYNAMICS:  Aortic pressure was 123/22mmHg; LV systolic pressure was ; LVEDP .  There was no gradient between the left ventricle and aorta.    ANGIOGRAPHIC DATA:    Left main: No angiographically significant coronary artery disease.  Left anterior descending (LAD): No angiographically significant coronary artery disease  Circumflex artery (CIRC): No angiographically significant coronary artery disease  Right coronary artery (RCA): No angiographically significant coronary artery disease. Comment vessel.  LEFT VENTRICULOGRAM:  Left ventricular angiogram was done in the 30 RAO projection and revealed generalized decreased/hypokinetic left ventricle with ejection fraction of approximately 30% On prior echocardiogram EF was originally 15% then 25%  IMPRESSIONS:  No angiographically significant CAD Decreased left ventricular systolic function.  LVEDP 14 mmHg.  Ejection fraction 30 %. Dilated cardiomyopathy-nonischemic. Possible viral etiology.  RECOMMENDATION:  I will continue to try to attempt to up titrate her medications. She states that she is feeling some dizziness in the morning hours so I will keep her at carvedilol 6.25 mg twice a day. She was asking about decreasing her Lasix. I'm fine with her trying every other day Lasix 40 mg. She is very strict about her daily weights. If her cardiomyopathy persists with ejection fraction of less than 35% on reevaluation, we will refer to electrophysiology for ICD.

## 2013-04-02 NOTE — Interval H&P Note (Signed)
History and Physical Interval Note:  04/02/2013 12:54 PM  Brittney Malone  has presented today for surgery, with the diagnosis of hf  The various methods of treatment have been discussed with the patient and family. After consideration of risks, benefits and other options for treatment, the patient has consented to  Procedure(s): LEFT HEART CATHETERIZATION WITH CORONARY ANGIOGRAM (N/A) as a surgical intervention .  [name] history has been reviewed, patient examined, no change in status, stable for surgery.  I have reviewed the patient's chart and labs.  Questions were answered to the patient's satisfaction.     Brittney Malone

## 2013-04-08 ENCOUNTER — Ambulatory Visit: Payer: Medicare Other

## 2013-04-19 ENCOUNTER — Encounter: Payer: Self-pay | Admitting: Cardiology

## 2013-04-19 ENCOUNTER — Ambulatory Visit (INDEPENDENT_AMBULATORY_CARE_PROVIDER_SITE_OTHER): Payer: Medicare Other | Admitting: Cardiology

## 2013-04-19 VITALS — BP 110/70 | HR 94 | Ht 62.0 in | Wt 189.0 lb

## 2013-04-19 DIAGNOSIS — E669 Obesity, unspecified: Secondary | ICD-10-CM

## 2013-04-19 DIAGNOSIS — I428 Other cardiomyopathies: Secondary | ICD-10-CM

## 2013-04-19 DIAGNOSIS — I420 Dilated cardiomyopathy: Secondary | ICD-10-CM

## 2013-04-19 DIAGNOSIS — I5022 Chronic systolic (congestive) heart failure: Secondary | ICD-10-CM

## 2013-04-19 DIAGNOSIS — Z0000 Encounter for general adult medical examination without abnormal findings: Secondary | ICD-10-CM

## 2013-04-19 LAB — BASIC METABOLIC PANEL
BUN: 13 mg/dL (ref 6–23)
CO2: 31 mEq/L (ref 19–32)
Calcium: 9.9 mg/dL (ref 8.4–10.5)
Chloride: 97 mEq/L (ref 96–112)
Creatinine, Ser: 0.8 mg/dL (ref 0.4–1.2)
GFR: 73.12 mL/min (ref >60.00)
Glucose, Bld: 146 mg/dL — ABNORMAL HIGH (ref 70–99)
Potassium: 4.3 mEq/L (ref 3.5–5.1)
Sodium: 136 mEq/L (ref 135–145)

## 2013-04-19 MED ORDER — CARVEDILOL 12.5 MG PO TABS: 12.5000 mg | ORAL_TABLET | Freq: Two times a day (BID) | ORAL | Status: DC

## 2013-04-19 NOTE — Patient Instructions (Signed)
Your physician has recommended you make the following change in your medication:   1. Increase Carvedilol 12.5mg  twice daily  Your physician recommends that you have labs today: BMET  Your physician has requested that you have an echocardiogram. Echocardiography is a painless test that uses sound waves to create images of your heart. It provides your doctor with information about the size and shape of your heart and how well your heart's chambers and valves are working. This procedure takes approximately one hour. There are no restrictions for this procedure. In 2 months, Same day appointment.  Your physician recommends that you schedule a follow-up appointment in: 2  months

## 2013-04-19 NOTE — Progress Notes (Signed)
[address] [place], [place]  [address] Phone: [phone] Fax:  [phone]  Date:  04/19/2013   ID:  Brittney Malone, DOB 12-Mar-1941, MRN [id number]  PCP:  Brittney Fila, RN   History of Present Illness: Brittney Malone is a 72 y.o. female with dilated cardiomyopathy, nonischemic, EF 30% heart catheterization 04/02/13 here for followup. Possible viral etiology. Attempting to up titrate medications. No dyspnea. No orthopnea. 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 Lasix every other day. Taking 6.25mg  PO BID.  Recurrent bouts of pneumonia.    Wt Readings from Last 3 Encounters:  04/19/13 189 lb (85.73 kg)  04/02/13 190 lb (86.183 kg)  04/02/13 190 lb (86.183 kg)     Past Medical History  Diagnosis Date  . Hypertension   . History of nuclear stress test     told results were WNL, - several yrs. ago    . Hypothyroidism   . Diabetes mellitus   . Neuromuscular disorder     neuropathy- feet- bilateral   . Arthritis     knees- OA, uses topical treatment   . Restless leg syndrome   . Osteoarthritis     Past Surgical History  Procedure Laterality Date  . Abdominal hysterectomy    . Eye surgery      cataracts (bilateral) - removed- /w IOL   . Tonsillectomy      as a youngster  . Appendectomy    . Maxillary antrostomy  02/02/2012    Procedure: MAXILLARY ANTROSTOMY;  Surgeon: [name], MD;  Location: [facility] OR;  Service: ENT;  Laterality: Left;  Left Maxillary Antrostomy [id number]  . Sphenoidectomy  02/02/2012    Procedure: SPHENOIDECTOMY;  Surgeon: [name], MD;  Location: [facility] OR;  Service: ENT;  Laterality: Left;  Left Sphenoidectomy [phone]  . Nasal sinus surgery  02/02/2012    Procedure: ENDOSCOPIC SINUS SURGERY WITH STEALTH;  Surgeon: [name], MD;  Location: [facility] OR;  Service: ENT;  Laterality: Left;  Left Frontal Sinusotomy [id number], and endoscopic resection of left anterior cranial fossa tumor [id number]  . Ethmoidectomy  02/02/2012   Procedure: ETHMOIDECTOMY;  Surgeon: [name], MD;  Location: [facility] OR;  Service: ENT;  Laterality: Left;  Left Ethmoidectomy [phone] with repair of CSF leak  . Carpal tunnel repair    . Knee cartilage surgery Left   . Cataract extraction, bilateral    . Other      Hemangiopericytoma, left nasal septum,repair of CSF leak    Current Outpatient Prescriptions  Medication Sig Dispense Refill  . acetaminophen (TYLENOL) 500 MG tablet Take 1,000 mg by mouth every 6 (six) hours as needed for pain.      . Calcium Carbonate-Vitamin D (CALCIUM 600/VITAMIN D PO) Take 1 tablet by mouth daily.      . carvedilol (COREG) 3.125 MG tablet Take 2 tablets (6.25 mg total) by mouth 2 (two) times daily with a meal.  60 tablet  5  . Cholecalciferol (VITAMIN D-3 PO) Take 1 tablet by mouth daily.       . digoxin (LANOXIN) 0.125 MG tablet Take 0.125 mg by mouth daily.      . furosemide (LASIX) 20 MG tablet Take 2 tablets (40 mg total) by mouth every other day.  30 tablet  6  . glipiZIDE-metformin (METAGLIP) 5-500 MG per tablet Take 1 tablet by mouth 2 (two) times daily before a meal.       . KLOR-CON 10 10 MEQ tablet Take 20  mEq by mouth daily.      [name], [name]      . lisinopril (PRINIVIL,ZESTRIL) 2.5 MG tablet Take 2.5 mg by mouth daily.      . Omega-3 Fatty Acids (FISH OIL) 1200 MG CAPS Take 1,200 mg by mouth 2 (two) times daily.      . Plant Sterols and Stanols (CHOLEST OFF) 450 MG TABS Take 450 mg by mouth 2 (two) times daily.       Marland Kitchen SALINE NASAL SPRAY NA Place 1 spray into both nostrils daily as needed (allergies).      Marland Kitchen spironolactone (ALDACTONE) 25 MG tablet Take 25 mg by mouth daily.       No current facility-administered medications for this visit.    Allergies:    Allergies  Allergen Reactions  . Aspirin Other (See Comments)    GI distress  . Augmentin [Amoxicillin-Pot Clavulanate] Other (See Comments)     unknown  . Erythromycin Nausea And Vomiting  . Levaquin [Levofloxacin In D5w] Diarrhea    Social History:  The patient  reports that she has never smoked. She does not have any smokeless tobacco history on file. She reports that she does not drink alcohol or use illicit drugs.   ROS:  Please see the history of present illness.   No syncope, no dizziness, no orthopnea, no PND. Improved symptoms.    PHYSICAL EXAM: VS:  BP 110/70  Pulse 94  Ht 5\' 2"  (1.575 m)  Wt 189 lb (85.73 kg)  BMI 34.56 kg/m2 Well nourished, well developed, in no acute distress HEENT: normal Neck: no JVD Cardiac:  normal S1, S2; RRR; no murmur Lungs:  clear to auscultation bilaterally, no wheezing, rhonchi or rales Abd: soft, nontender, no hepatomegalyOverweight Ext: no edema Skin: warm and dry Neuro: no focal abnormalities noted  EKG:  None today. Cardiac catheterization reviewed as above. Reassuring.     ASSESSMENT AND PLAN:  1. Nonischemic cardiomyopathy/chronic systolic heart failure-discussed medications. I'm going to increase her carvedilol to 12.5 mg twice a day. Continue her lisinopril at 2.5 mg. Her pulse certainly will tolerate increase beta blocker. Blood pressure may be an issue. She has a home blood pressure cuff and will call me if hypotension occurs. We will repeat echocardiogram in 2 months. I will check basic metabolic profile today. On Aldactone. 2. Obesity-encourage weight loss. Watching Lasix every other day. She may be able to take Lasix less often. 3. Tachycardia-improved. Continue with digoxin, increasing carvedilol.  Signed, Brittney Schultz, MD [facility]  04/19/2013 1:53 PM

## 2013-05-06 ENCOUNTER — Ambulatory Visit
Admission: RE | Admit: 2013-05-06 | Discharge: 2013-05-06 | Disposition: A | Discharge status: Home / Self Care | Payer: Medicare Other | Source: Ambulatory Visit

## 2013-05-06 DIAGNOSIS — Z1231 Encounter for screening mammogram for malignant neoplasm of breast: Secondary | ICD-10-CM

## 2013-05-07 ENCOUNTER — Other Ambulatory Visit: Payer: Self-pay | Admitting: Family Medicine

## 2013-05-07 DIAGNOSIS — R928 Other abnormal and inconclusive findings on diagnostic imaging of breast: Secondary | ICD-10-CM

## 2013-05-13 ENCOUNTER — Ambulatory Visit
Admission: RE | Admit: 2013-05-13 | Discharge: 2013-05-13 | Disposition: A | Discharge status: Home / Self Care | Payer: Medicare Other | Source: Ambulatory Visit | Attending: Family Medicine | Admitting: Family Medicine

## 2013-05-13 DIAGNOSIS — R928 Other abnormal and inconclusive findings on diagnostic imaging of breast: Secondary | ICD-10-CM

## 2013-06-21 ENCOUNTER — Ambulatory Visit (HOSPITAL_COMMUNITY): Payer: Medicare Other | Attending: Cardiovascular Disease | Admitting: Cardiology

## 2013-06-21 ENCOUNTER — Encounter: Payer: Self-pay | Admitting: Cardiovascular Disease

## 2013-06-21 ENCOUNTER — Encounter: Payer: Self-pay | Admitting: Cardiology

## 2013-06-21 ENCOUNTER — Ambulatory Visit (INDEPENDENT_AMBULATORY_CARE_PROVIDER_SITE_OTHER): Payer: Medicare Other | Admitting: Cardiology

## 2013-06-21 VITALS — BP 110/61 | HR 67 | Ht 62.0 in | Wt 184.0 lb

## 2013-06-21 DIAGNOSIS — E119 Type 2 diabetes mellitus without complications: Secondary | ICD-10-CM

## 2013-06-21 DIAGNOSIS — I359 Nonrheumatic aortic valve disorder, unspecified: Secondary | ICD-10-CM | POA: Insufficient documentation

## 2013-06-21 DIAGNOSIS — I059 Rheumatic mitral valve disease, unspecified: Secondary | ICD-10-CM | POA: Insufficient documentation

## 2013-06-21 DIAGNOSIS — I509 Heart failure, unspecified: Secondary | ICD-10-CM | POA: Insufficient documentation

## 2013-06-21 DIAGNOSIS — I5022 Chronic systolic (congestive) heart failure: Secondary | ICD-10-CM

## 2013-06-21 DIAGNOSIS — I428 Other cardiomyopathies: Secondary | ICD-10-CM | POA: Insufficient documentation

## 2013-06-21 DIAGNOSIS — I420 Dilated cardiomyopathy: Secondary | ICD-10-CM

## 2013-06-21 DIAGNOSIS — E669 Obesity, unspecified: Secondary | ICD-10-CM

## 2013-06-21 DIAGNOSIS — I471 Supraventricular tachycardia, unspecified: Secondary | ICD-10-CM

## 2013-06-21 DIAGNOSIS — I10 Essential (primary) hypertension: Secondary | ICD-10-CM | POA: Insufficient documentation

## 2013-06-21 DIAGNOSIS — E785 Hyperlipidemia, unspecified: Secondary | ICD-10-CM | POA: Insufficient documentation

## 2013-06-21 DIAGNOSIS — Z6834 Body mass index (BMI) 34.0-34.9, adult: Secondary | ICD-10-CM | POA: Insufficient documentation

## 2013-06-21 LAB — BASIC METABOLIC PANEL
BUN: 11 mg/dL (ref 6–23)
CO2: 26 mEq/L (ref 19–32)
Calcium: 9.2 mg/dL (ref 8.4–10.5)
Chloride: 102 mEq/L (ref 96–112)
Creatinine, Ser: 0.6 mg/dL (ref 0.4–1.2)
GFR: 100.91 mL/min (ref >60.00)
Glucose, Bld: 124 mg/dL — ABNORMAL HIGH (ref 70–99)
Potassium: 4.1 mEq/L (ref 3.5–5.1)
Sodium: 137 mEq/L (ref 135–145)

## 2013-06-21 MED ORDER — FUROSEMIDE 20 MG PO TABS: 20.0000 mg | ORAL_TABLET | ORAL | Status: DC

## 2013-06-21 NOTE — Progress Notes (Signed)
[place]. [address], [address], [place]  [address] Phone: [phone] Fax:  [phone]  Date:  06/21/2013   ID:  Brittney Malone, DOB 1940-07-16, MRN [id number]  PCP:  [name], MD   History of Present Illness: Brittney Malone is a 73 y.o. female with dilated cardiomyopathy, nonischemic, EF 30% heart catheterization 04/02/13 here for followup. Possible viral etiology. Attempting to up titrate medications. No dyspnea. No orthopnea. 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 Lasix every other day.   Nagging pains in flank that come and go. Overall feels somewhat fatigued. Tolerating medications. She has a robust response to Lasix. She is taking this every other day.  Recurrent bouts of pneumonia.    Wt Readings from Last 3 Encounters:  06/21/13 184 lb (83.462 kg)  04/19/13 189 lb (85.73 kg)  04/02/13 190 lb (86.183 kg)     Past Medical History  Diagnosis Date  . Hypertension   . History of nuclear stress test     told results were WNL, - several yrs. ago    . Hypothyroidism   . Diabetes mellitus   . Neuromuscular disorder     neuropathy- feet- bilateral   . Arthritis     knees- OA, uses topical treatment   . Restless leg syndrome   . Osteoarthritis     Past Surgical History  Procedure Laterality Date  . Abdominal hysterectomy    . Eye surgery      cataracts (bilateral) - removed- /w IOL   . Tonsillectomy      as a youngster  . Appendectomy    . Maxillary antrostomy  02/02/2012    Procedure: MAXILLARY ANTROSTOMY;  Surgeon: Ruby Cola, MD;  Location: [facility] OR;  Service: ENT;  Laterality: Left;  Left Maxillary Antrostomy [id number]  . Sphenoidectomy  02/02/2012    Procedure: SPHENOIDECTOMY;  Surgeon: Ruby Cola, MD;  Location: [place];  Service: ENT;  Laterality: Left;  Left Sphenoidectomy [phone]  . Nasal sinus surgery  02/02/2012    Procedure: ENDOSCOPIC SINUS SURGERY WITH STEALTH;  Surgeon: Ruby Cola, MD;  Location: [facility] OR;  Service: ENT;   Laterality: Left;  Left Frontal Sinusotomy [id number], and endoscopic resection of left anterior cranial fossa tumor [id number]  . Ethmoidectomy  02/02/2012    Procedure: ETHMOIDECTOMY;  Surgeon: Ruby Cola, MD;  Location: [place];  Service: ENT;  Laterality: Left;  Left Ethmoidectomy [phone] with repair of CSF leak  . Carpal tunnel repair    . Knee cartilage surgery Left   . Cataract extraction, bilateral    . Other      Hemangiopericytoma, left nasal septum,repair of CSF leak    Current Outpatient Prescriptions  Medication Sig Dispense Refill  . acetaminophen (TYLENOL) 500 MG tablet Take 1,000 mg by mouth every 6 (six) hours as needed for pain.      . Calcium Carbonate-Vitamin D (CALCIUM 600/VITAMIN D PO) Take 1 tablet by mouth daily.      . carvedilol (COREG) 12.5 MG tablet Take 1 tablet (12.5 mg total) by mouth 2 (two) times daily.  60 tablet  5  . Cholecalciferol (VITAMIN D-3 PO) Take 1,000 Units by mouth daily.       . digoxin (LANOXIN) 0.125 MG tablet Take 0.125 mg by mouth daily.      . furosemide (LASIX) 20 MG tablet Take 20 mg by mouth every other day.      Marland Kitchen glipiZIDE-metformin (METAGLIP) 5-500 MG per tablet Take 1 tablet by mouth  2 (two) times daily before a meal.       . KLOR-CON 10 10 MEQ tablet Take 20 mEq by mouth daily.      [name], [name]      . lisinopril (PRINIVIL,ZESTRIL) 2.5 MG tablet Take 2.5 mg by mouth daily.      . Misc Natural Products (OSTEO BI-FLEX JOINT SHIELD PO) Take by mouth.      . Omega-3 Fatty Acids (FISH OIL) 1200 MG CAPS Take 1,200 mg by mouth 2 (two) times daily.      . Plant Sterols and Stanols (CHOLEST OFF) 450 MG TABS Take 450 mg by mouth 2 (two) times daily.       Marland Kitchen SALINE NASAL SPRAY NA Place 1 spray into both nostrils daily as needed (allergies).      Marland Kitchen spironolactone (ALDACTONE) 25 MG tablet Take 25 mg by mouth daily.       No current facility-administered medications  for this visit.    Allergies:    Allergies  Allergen Reactions  . Aspirin Other (See Comments)    GI distress  . Augmentin [Amoxicillin-Pot Clavulanate] Other (See Comments)    unknown  . Erythromycin Nausea And Vomiting  . Levaquin [Levofloxacin In D5w] Diarrhea    Social History:  The patient  reports that she has never smoked. She does not have any smokeless tobacco history on file. She reports that she does not drink alcohol or use illicit drugs.   ROS:  Please see the history of present illness.   No syncope, no dizziness, no orthopnea, no PND. Improved symptoms.    PHYSICAL EXAM: VS:  BP 110/61  Pulse 67  Ht 5\' 2"  (1.575 m)  Wt 184 lb (83.462 kg)  BMI 33.65 kg/m2 Well nourished, well developed, in no acute distress HEENT: normal Neck: no JVD Cardiac:  normal S1, S2; RRR; no murmur Lungs:  clear to auscultation bilaterally, no wheezing, rhonchi or rales Abd: soft, nontender, no hepatomegalyOverweight Ext: no edema Skin: warm and dry Neuro: no focal abnormalities noted  EKG:  None today. Cardiac catheterization reviewed as above. Reassuring.     ASSESSMENT AND PLAN:  1. Nonischemic cardiomyopathy/chronic systolic heart failure-discussed medications. Carvedilol to 12.5 mg twice a day. Continue her lisinopril at 2.5 mg. Her pulse certainly will tolerate increase beta blocker. Blood pressure may be an issue. She has a home blood pressure cuff and will call me if hypotension occurs. I will check basic metabolic profile today. On Aldactone. 2. Obesity-encourage weight loss. Watching Lasix every other day. She may be able to take Lasix less often. 3. Tachycardia-improved. Continue with digoxin, increasing carvedilol. This was likely mediated by cardiomyopathy.  Signed, Candee Furbish, MD [facility]  06/21/2013 11:12 AM

## 2013-06-21 NOTE — Patient Instructions (Signed)
Your physician recommends that you continue on your current medications as directed. Please refer to the Current Medication list given to you today.  Your physician recommends that you have labs today: BMET  Your physician recommends that you schedule a follow-up appointment in: 3 months with Dr. Marlou Porch

## 2013-06-21 NOTE — Progress Notes (Signed)
Echo performed. 

## 2013-07-02 ENCOUNTER — Telehealth: Payer: Self-pay | Admitting: Cardiology

## 2013-07-02 NOTE — Telephone Encounter (Signed)
Follow up    Returning call back - test results

## 2013-07-02 NOTE — Telephone Encounter (Signed)
°  Patient is returning your call, please call back.  °

## 2013-07-03 ENCOUNTER — Telehealth: Payer: Self-pay | Admitting: Cardiology

## 2013-07-03 NOTE — Telephone Encounter (Signed)
patient is call back in for test results. Please call back.

## 2013-07-03 NOTE — Telephone Encounter (Signed)
Patient also mentioned an allergic reaction or breakout on her legs and arms, wanted to know if benadryl was ok to take with her current meds. Advised that low dose OTC benadryl pill or liquid form was ok. Also explained if issue get worse to consult with PCP or an Urgent Care

## 2013-07-03 NOTE — Telephone Encounter (Signed)
Message copied by Marika Mahaffy, Jerl Mina on Wed Jul 03, 2013  6:33 PM ------      Message from: Jerline Pain      Created: Fri Jun 21, 2013  4:40 PM       Reassuring. ------

## 2013-07-03 NOTE — Telephone Encounter (Signed)
Patient explained lab and Echo results, please review phone note.

## 2013-07-25 ENCOUNTER — Other Ambulatory Visit: Payer: Self-pay | Admitting: Dermatology

## 2013-09-18 ENCOUNTER — Ambulatory Visit (INDEPENDENT_AMBULATORY_CARE_PROVIDER_SITE_OTHER): Payer: Medicare Other | Admitting: Cardiology

## 2013-09-18 ENCOUNTER — Encounter: Payer: Self-pay | Admitting: Cardiology

## 2013-09-18 VITALS — BP 127/64 | HR 77 | Ht 62.0 in | Wt 181.0 lb

## 2013-09-18 DIAGNOSIS — E669 Obesity, unspecified: Secondary | ICD-10-CM

## 2013-09-18 DIAGNOSIS — I420 Dilated cardiomyopathy: Secondary | ICD-10-CM

## 2013-09-18 DIAGNOSIS — I5022 Chronic systolic (congestive) heart failure: Secondary | ICD-10-CM

## 2013-09-18 DIAGNOSIS — R000 Tachycardia, unspecified: Secondary | ICD-10-CM

## 2013-09-18 DIAGNOSIS — I428 Other cardiomyopathies: Secondary | ICD-10-CM

## 2013-09-18 MED ORDER — LISINOPRIL 5 MG PO TABS: 5.0000 mg | ORAL_TABLET | Freq: Every day | ORAL | Status: DC

## 2013-09-18 MED ORDER — CARVEDILOL 25 MG PO TABS: 25.0000 mg | ORAL_TABLET | Freq: Two times a day (BID) | ORAL | Status: DC

## 2013-09-18 MED ORDER — CARVEDILOL 25 MG PO TABS: 12.5000 mg | ORAL_TABLET | Freq: Two times a day (BID) | ORAL | Status: DC

## 2013-09-18 NOTE — Progress Notes (Signed)
[place]. [address] [place], [place]  [address] Phone: [phone] Fax:  [phone]  Date:  09/18/2013   ID:  Brittney Malone, DOB 01-Jun-1941, MRN [id number]  PCP:  [name], MD   History of Present Illness: Brittney Malone is a 73 y.o. female with dilated cardiomyopathy, nonischemic, EF 35% heart catheterization 04/02/13 here for followup. Possible viral, familial etiology. Attempting to up titrate medications. No dyspnea. No orthopnea. 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 Lasix every other day.   Overall feels somewhat fatigued. Tolerating medications. She has a robust response to Lasix. She is taking this every other day.  Previous Recurrent bouts of pneumonia.    Wt Readings from Last 3 Encounters:  09/18/13 181 lb (82.101 kg)  06/21/13 184 lb (83.462 kg)  04/19/13 189 lb (85.73 kg)     Past Medical History  Diagnosis Date  . Hypertension   . History of nuclear stress test     told results were WNL, - several yrs. ago    . Hypothyroidism   . Diabetes mellitus   . Neuromuscular disorder     neuropathy- feet- bilateral   . Arthritis     knees- OA, uses topical treatment   . Restless leg syndrome   . Osteoarthritis     Past Surgical History  Procedure Laterality Date  . Abdominal hysterectomy    . Eye surgery      cataracts (bilateral) - removed- /w IOL   . Tonsillectomy      as a youngster  . Appendectomy    . Maxillary antrostomy  02/02/2012    Procedure: MAXILLARY ANTROSTOMY;  Surgeon: Ruby Cola, MD;  Location: [facility];  Service: ENT;  Laterality: Left;  Left Maxillary Antrostomy [id number]  . Sphenoidectomy  02/02/2012    Procedure: SPHENOIDECTOMY;  Surgeon: Ruby Cola, MD;  Location: [place];  Service: ENT;  Laterality: Left;  Left Sphenoidectomy [phone]  . Nasal sinus surgery  02/02/2012    Procedure: ENDOSCOPIC SINUS SURGERY WITH STEALTH;  Surgeon: Ruby Cola, MD;  Location: [facility] OR;  Service: ENT;  Laterality: Left;  Left  Frontal Sinusotomy [id number], and endoscopic resection of left anterior cranial fossa tumor [id number]  . Ethmoidectomy  02/02/2012    Procedure: ETHMOIDECTOMY;  Surgeon: Ruby Cola, MD;  Location: [place];  Service: ENT;  Laterality: Left;  Left Ethmoidectomy [phone] with repair of CSF leak  . Carpal tunnel repair    . Knee cartilage surgery Left   . Cataract extraction, bilateral    . Other      Hemangiopericytoma, left nasal septum,repair of CSF leak    Current Outpatient Prescriptions  Medication Sig Dispense Refill  . acetaminophen (TYLENOL) 500 MG tablet Take 1,000 mg by mouth every 6 (six) hours as needed for pain.      . Calcium Carbonate-Vitamin D (CALCIUM 600/VITAMIN D PO) Take 1 tablet by mouth daily.      . carvedilol (COREG) 12.5 MG tablet Take 1 tablet (12.5 mg total) by mouth 2 (two) times daily.  60 tablet  5  . Cholecalciferol (VITAMIN D-3 PO) Take 1,000 Units by mouth daily.       . digoxin (LANOXIN) 0.125 MG tablet Take 0.125 mg by mouth daily.      . furosemide (LASIX) 20 MG tablet Take 1 tablet (20 mg total) by mouth every other day.  30 tablet  5  . glipiZIDE-metformin (METAGLIP) 5-500 MG per tablet Take 1 tablet by mouth 2 (  two) times daily before a meal.       . KLOR-CON 10 10 MEQ tablet Take 20 mEq by mouth daily.      [name], [name]      . lisinopril (PRINIVIL,ZESTRIL) 2.5 MG tablet Take 2.5 mg by mouth daily.      . Misc Natural Products (OSTEO BI-FLEX JOINT SHIELD PO) Take by mouth.      . Omega-3 Fatty Acids (FISH OIL) 1200 MG CAPS Take 1,200 mg by mouth daily.       . Plant Sterols and Stanols (CHOLEST OFF) 450 MG TABS Take 450 mg by mouth 2 (two) times daily.       Marland Kitchen SALINE NASAL SPRAY NA Place 1 spray into both nostrils daily as needed (allergies).      Marland Kitchen spironolactone (ALDACTONE) 25 MG tablet Take 25 mg by mouth daily.       No current facility-administered medications for this  visit.    Allergies:    Allergies  Allergen Reactions  . Aspirin Other (See Comments)    GI distress  . Augmentin [Amoxicillin-Pot Clavulanate] Other (See Comments)    unknown  . Erythromycin Nausea And Vomiting  . Levaquin [Levofloxacin In D5w] Diarrhea    Social History:  The patient  reports that she has never smoked. She does not have any smokeless tobacco history on file. She reports that she does not drink alcohol or use illicit drugs.   ROS:  Please see the history of present illness.   No syncope, no dizziness, no orthopnea, no PND. Improved symptoms.    PHYSICAL EXAM: VS:  BP 127/64  Pulse 77  Ht 5\' 2"  (1.575 m)  Wt 181 lb (82.101 kg)  BMI 33.10 kg/m2 Well nourished, well developed, in no acute distress HEENT: normal Neck: no JVD Cardiac:  normal S1, S2; RRR; no murmur Lungs:  clear to auscultation bilaterally, no wheezing, rhonchi or rales Abd: soft, nontender, no hepatomegalyOverweight Ext: no edema Skin: warm and dry Neuro: no focal abnormalities noted  EKG:  None today. Cardiac catheterization reviewed as above. Reassuring.     ECHO: 06/21/13 - EF 30-35%, mild improvement.  LABS: 06/21/13-creatinine 0.6, potassium 4.1  ASSESSMENT AND PLAN:  1. Nonischemic cardiomyopathy/chronic systolic heart failure-likely familial cardiomyopathy. Unfortunately her son was just diagnosed with cardiomyopathy as well. Her mother was diagnosed. She does have other children and it would not be unreasonable to have them evaluated. In the meantime, I will increase her carvedilol to 25 mg twice a day and increase her lisinopril to 5 mg once a day. At next clinic visit I will likely try to increase her lisinopril to 10 mg. Discussed medications. Carvedilol to 12.5 mg twice a day. Continue her lisinopril at 2.5 mg. Her pulse certainly will tolerate increase beta blocker. Blood pressure may be an issue. She has a home blood pressure cuff and will call me if hypotension occurs. I will check  basic metabolic profile today. On Aldactone. 2. Obesity-encourage weight loss. Watching with Lasix every other day.  3. Tachycardia-improved. Continue with digoxin, increasing carvedilol. This was likely mediated by cardiomyopathy. Ejection fraction has improved. We'll repeat echocardiogram at future date.  Signed, Candee Furbish, MD [facility]  09/18/2013 11:16 AM

## 2013-09-18 NOTE — Patient Instructions (Addendum)
Your physician has recommended you make the following change in your medication:  1)INCREASE CARVEDILOL TO 25 MG TWICE DAILY 2)INCREASE LISINOPRIL TO 5MG  DAILY  YOU HAVE A FOLLOW UP APPT SCHEDULED FOR 10/11/13 @9 :15AM

## 2013-10-04 ENCOUNTER — Encounter: Payer: Self-pay | Admitting: Cardiology

## 2013-10-07 ENCOUNTER — Encounter: Payer: Self-pay | Admitting: Cardiology

## 2013-10-11 ENCOUNTER — Ambulatory Visit (INDEPENDENT_AMBULATORY_CARE_PROVIDER_SITE_OTHER): Payer: Medicare Other | Admitting: Cardiology

## 2013-10-11 ENCOUNTER — Encounter: Payer: Self-pay | Admitting: Cardiology

## 2013-10-11 VITALS — BP 124/50 | HR 65 | Ht 62.0 in | Wt 179.0 lb

## 2013-10-11 DIAGNOSIS — I420 Dilated cardiomyopathy: Secondary | ICD-10-CM

## 2013-10-11 DIAGNOSIS — E669 Obesity, unspecified: Secondary | ICD-10-CM

## 2013-10-11 DIAGNOSIS — I428 Other cardiomyopathies: Secondary | ICD-10-CM

## 2013-10-11 DIAGNOSIS — I471 Supraventricular tachycardia: Secondary | ICD-10-CM

## 2013-10-11 DIAGNOSIS — I5022 Chronic systolic (congestive) heart failure: Secondary | ICD-10-CM

## 2013-10-11 LAB — BASIC METABOLIC PANEL
BUN: 13 mg/dL (ref 6–23)
CO2: 29 mEq/L (ref 19–32)
Calcium: 9.5 mg/dL (ref 8.4–10.5)
Chloride: 102 mEq/L (ref 96–112)
Creatinine, Ser: 0.7 mg/dL (ref 0.4–1.2)
GFR: 83.5 mL/min (ref >60.00)
Glucose, Bld: 201 mg/dL — ABNORMAL HIGH (ref 70–99)
Potassium: 4.4 mEq/L (ref 3.5–5.1)
Sodium: 139 mEq/L (ref 135–145)

## 2013-10-11 MED ORDER — LISINOPRIL 5 MG PO TABS: 10.0000 mg | ORAL_TABLET | Freq: Every day | ORAL | Status: DC

## 2013-10-11 MED ORDER — CARVEDILOL 12.5 MG PO TABS: 12.5000 mg | ORAL_TABLET | Freq: Two times a day (BID) | ORAL | Status: DC

## 2013-10-11 NOTE — Progress Notes (Signed)
[place]. [address], [address] [place], [place]  [address] Phone: [phone] Fax:  [phone]  Date:  10/11/2013   ID:  Brittney Malone, DOB 02-22-1941, MRN [id number]  PCP:  [name], MD   History of Present Illness: Brittney Malone is a 73 y.o. female with dilated cardiomyopathy, nonischemic, EF 30% heart catheterization 04/02/13 here for followup. Possible viral etiology. Attempting to up titrate medications. No dyspnea. No orthopnea. 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 Lasix every other day.   She could not tolerate 25 mg of carvedilol twice a day. She felt fatigued as well as some chest discomfort with this she states. She's also had intermittent diarrhea. No significant shortness of breath currently.  Nagging pains in flank that come and go. Overall feels somewhat fatigued. She has a robust response to Lasix. She is taking this every other day.  Prior recurrent bouts of pneumonia.    Wt Readings from Last 3 Encounters:  10/11/13 179 lb (81.194 kg)  09/18/13 181 lb (82.101 kg)  06/21/13 184 lb (83.462 kg)     Past Medical History  Diagnosis Date  . Hypertension   . History of nuclear stress test     told results were WNL, - several yrs. ago    . Hypothyroidism   . Diabetes mellitus   . Neuromuscular disorder     neuropathy- feet- bilateral   . Arthritis     knees- OA, uses topical treatment   . Restless leg syndrome   . Osteoarthritis     Past Surgical History  Procedure Laterality Date  . Abdominal hysterectomy    . Eye surgery      cataracts (bilateral) - removed- /w IOL   . Tonsillectomy      as a youngster  . Appendectomy    . Maxillary antrostomy  02/02/2012    Procedure: MAXILLARY ANTROSTOMY;  Surgeon: Ruby Cola, MD;  Location: [facility] OR;  Service: ENT;  Laterality: Left;  Left Maxillary Antrostomy [id number]  . Sphenoidectomy  02/02/2012    Procedure: SPHENOIDECTOMY;  Surgeon: Ruby Cola, MD;  Location: [place];  Service: ENT;   Laterality: Left;  Left Sphenoidectomy [phone]  . Nasal sinus surgery  02/02/2012    Procedure: ENDOSCOPIC SINUS SURGERY WITH STEALTH;  Surgeon: Ruby Cola, MD;  Location: [facility] OR;  Service: ENT;  Laterality: Left;  Left Frontal Sinusotomy [id number], and endoscopic resection of left anterior cranial fossa tumor [id number]  . Ethmoidectomy  02/02/2012    Procedure: ETHMOIDECTOMY;  Surgeon: Ruby Cola, MD;  Location: [place];  Service: ENT;  Laterality: Left;  Left Ethmoidectomy [phone] with repair of CSF leak  . Carpal tunnel repair    . Knee cartilage surgery Left   . Cataract extraction, bilateral    . Other      Hemangiopericytoma, left nasal septum,repair of CSF leak    Current Outpatient Prescriptions  Medication Sig Dispense Refill  . acetaminophen (TYLENOL) 500 MG tablet Take 1,000 mg by mouth every 6 (six) hours as needed for pain.      . Calcium Carbonate-Vitamin D (CALCIUM 600/VITAMIN D PO) Take 1 tablet by mouth daily.      . carvedilol (COREG) 12.5 MG tablet Take 12.5 mg by mouth 2 (two) times daily with a meal.      . Cholecalciferol (VITAMIN D-3 PO) Take 1,000 Units by mouth daily.       . digoxin (LANOXIN) 0.125 MG tablet Take 0.125 mg by mouth daily.      Marland Kitchen  furosemide (LASIX) 20 MG tablet Take 1 tablet (20 mg total) by mouth every other day.  30 tablet  5  . glipiZIDE-metformin (METAGLIP) 5-500 MG per tablet Take 2 tablets by mouth 2 (two) times daily before a meal.       . KLOR-CON 10 10 MEQ tablet Take 20 mEq by mouth daily.      [name], [name]      . lisinopril (PRINIVIL,ZESTRIL) 5 MG tablet Take 1 tablet (5 mg total) by mouth daily.  30 tablet  11  . Misc Natural Products (OSTEO BI-FLEX JOINT SHIELD PO) Take by mouth.      . Omega-3 Fatty Acids (FISH OIL) 1200 MG CAPS Take 1,200 mg by mouth daily.       . Plant Sterols and Stanols (CHOLEST OFF) 450 MG TABS Take 450 mg by mouth 2 (two) times daily.        Marland Kitchen SALINE NASAL SPRAY NA Place 1 spray into both nostrils daily as needed (allergies).      Marland Kitchen spironolactone (ALDACTONE) 25 MG tablet Take 25 mg by mouth daily.       No current facility-administered medications for this visit.    Allergies:    Allergies  Allergen Reactions  . Aspirin Other (See Comments)    GI distress  . Augmentin [Amoxicillin-Pot Clavulanate] Other (See Comments)    unknown  . Erythromycin Nausea And Vomiting  . Levaquin [Levofloxacin In D5w] Diarrhea    Social History:  The patient  reports that she has never smoked. She does not have any smokeless tobacco history on file. She reports that she does not drink alcohol or use illicit drugs.   ROS:  Please see the history of present illness.   Occasional diarrhea. No syncope, no dizziness, no orthopnea, no PND. Improved symptoms.    PHYSICAL EXAM: VS:  BP 124/50  Pulse 65  Ht 5\' 2"  (1.575 m)  Wt 179 lb (81.194 kg)  BMI 32.73 kg/m2 Well nourished, well developed, in no acute distress HEENT: normal Neck: no JVD Cardiac:  normal S1, S2; RRR; 1/6 S murmur Lungs:  clear to auscultation bilaterally, no wheezing, rhonchi or rales Abd: soft, nontender, no hepatomegalyOverweight Ext: no edema Skin: warm and dry Neuro: no focal abnormalities noted  EKG:  None today. Cardiac catheterization reviewed as above. Reassuring.     ASSESSMENT AND PLAN:  1. Nonischemic cardiomyopathy/chronic systolic heart failure-discussed medications. Carvedilol to 12.5 mg twice a day. Could not tolerate 25mg  BID.  Increase her lisinopril to 10 mg. I will check basic metabolic profile today. On Aldactone. 2. Obesity-encourage weight loss. Watching Lasix every other day. She may be able to take Lasix less often. Doing well. 3. Tachycardia-improved. PAT. Continue with digoxin, increasing carvedilol. This was likely mediated by cardiomyopathy. 4. RTC in 2 months  Signed, Candee Furbish, MD [facility]  10/11/2013 9:29 AM

## 2013-10-11 NOTE — Patient Instructions (Addendum)
Your physician has recommended you make the following change in your medication:   1. Increase Lisinopril to 10 mg once daily.  Your physician recommends that you have lab work today: BMET  Your physician recommends that you schedule a follow-up appointment in: 2 months

## 2013-11-01 ENCOUNTER — Encounter: Payer: Self-pay | Admitting: Cardiology

## 2013-12-05 ENCOUNTER — Other Ambulatory Visit: Payer: Self-pay | Admitting: Cardiology

## 2013-12-11 ENCOUNTER — Ambulatory Visit (INDEPENDENT_AMBULATORY_CARE_PROVIDER_SITE_OTHER): Payer: Medicare Other | Admitting: Cardiology

## 2013-12-11 ENCOUNTER — Encounter: Payer: Self-pay | Admitting: Cardiology

## 2013-12-11 VITALS — BP 118/64 | HR 80 | Ht 62.0 in | Wt 179.4 lb

## 2013-12-11 DIAGNOSIS — E785 Hyperlipidemia, unspecified: Secondary | ICD-10-CM

## 2013-12-11 DIAGNOSIS — R000 Tachycardia, unspecified: Secondary | ICD-10-CM

## 2013-12-11 DIAGNOSIS — I428 Other cardiomyopathies: Secondary | ICD-10-CM

## 2013-12-11 DIAGNOSIS — I420 Dilated cardiomyopathy: Secondary | ICD-10-CM

## 2013-12-11 DIAGNOSIS — I5022 Chronic systolic (congestive) heart failure: Secondary | ICD-10-CM

## 2013-12-11 NOTE — Progress Notes (Signed)
[place]. [address] [place], [place]  [address] Phone: [phone] Fax:  [phone]  Date:  12/11/2013   ID:  Brittney Malone, DOB 09-08-1940, MRN [id number]  PCP:  [name], MD   History of Present Illness: Brittney Malone is a 73 y.o. female with dilated cardiomyopathy, nonischemic, EF 30% heart catheterization 04/02/13 here for followup. Possible viral etiology. Attempted to up titrate medications, could not tolerate higher dose of carvedilol or lisinopril. No dyspnea. No orthopnea. Low energy. 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 Lasix every other day.   She could not tolerate 25 mg of carvedilol twice a day. She felt fatigued as well as some chest discomfort with this she states. She's also had intermittent diarrhea. No significant shortness of breath currently.  Nagging pains in flank that come and go. Overall feels somewhat fatigued. She has a robust response to Lasix. She is taking this every other day.  Prior recurrent bouts of pneumonia.    Wt Readings from Last 3 Encounters:  12/11/13 179 lb 6.4 oz (81.375 kg)  10/11/13 179 lb (81.194 kg)  09/18/13 181 lb (82.101 kg)     Past Medical History  Diagnosis Date  . Hypertension   . History of nuclear stress test     told results were WNL, - several yrs. ago    . Hypothyroidism   . Diabetes mellitus   . Neuromuscular disorder     neuropathy- feet- bilateral   . Arthritis     knees- OA, uses topical treatment   . Restless leg syndrome   . Osteoarthritis     Past Surgical History  Procedure Laterality Date  . Abdominal hysterectomy    . Eye surgery      cataracts (bilateral) - removed- /w IOL   . Tonsillectomy      as a youngster  . Appendectomy    . Maxillary antrostomy  02/02/2012    Procedure: MAXILLARY ANTROSTOMY;  Surgeon: Ruby Cola, MD;  Location: [facility] OR;  Service: ENT;  Laterality: Left;  Left Maxillary Antrostomy [id number]  . Sphenoidectomy  02/02/2012   Procedure: SPHENOIDECTOMY;  Surgeon: Ruby Cola, MD;  Location: [place];  Service: ENT;  Laterality: Left;  Left Sphenoidectomy [phone]  . Nasal sinus surgery  02/02/2012    Procedure: ENDOSCOPIC SINUS SURGERY WITH STEALTH;  Surgeon: Ruby Cola, MD;  [facility] OR;  Service: ENT;  Laterality: Left;  Left Frontal Sinusotomy [id number], and endoscopic resection of left anterior cranial fossa tumor [id number]  . Ethmoidectomy  02/02/2012    Procedure: ETHMOIDECTOMY;  Surgeon: Ruby Cola, MD;  Location: [place];  Service: ENT;  Laterality: Left;  Left Ethmoidectomy [phone] with repair of CSF leak  . Carpal tunnel repair    . Knee cartilage surgery Left   . Cataract extraction, bilateral    . Other      Hemangiopericytoma, left nasal septum,repair of CSF leak    Current Outpatient Prescriptions  Medication Sig Dispense Refill  . acetaminophen (TYLENOL) 500 MG tablet Take 1,000 mg by mouth every 6 (six) hours as needed for pain.      . Calcium Carbonate-Vitamin D (CALCIUM 600/VITAMIN D PO) Take 1 tablet by mouth daily.      . carvedilol (COREG) 12.5 MG tablet Take 1 tablet (12.5 mg total) by mouth 2 (two) times daily with a meal.  60 tablet  5  . Cholecalciferol (VITAMIN D-3 PO) Take 1,000 Units by mouth daily.       Marland Kitchen  digoxin (LANOXIN) 0.125 MG tablet Take 0.125 mg by mouth daily.      . furosemide (LASIX) 20 MG tablet Take 1 tablet (20 mg total) by mouth every other day.  30 tablet  5  . glipiZIDE-metformin (METAGLIP) 5-500 MG per tablet Take 2 tablets by mouth 2 (two) times daily before a meal.       . KLOR-CON M10 10 MEQ tablet       . levothyroxine (SYNTHROID, [name]      . lisinopril (PRINIVIL,ZESTRIL) 5 MG tablet TAKE 1 TABLET(10 MG TOTAL) BY MOUTH DAILY.      Marland Kitchen Misc Natural Products (OSTEO BI-FLEX JOINT SHIELD PO) Take by mouth.      . Omega-3 Fatty Acids (FISH OIL) 1200 MG CAPS Take 1,200 mg by mouth daily.       . Plant Sterols  and Stanols (CHOLEST OFF) 450 MG TABS Take 450 mg by mouth 2 (two) times daily.       Marland Kitchen SALINE NASAL SPRAY NA Place 1 spray into both nostrils daily as needed (allergies).      Marland Kitchen spironolactone (ALDACTONE) 25 MG tablet Take 25 mg by mouth daily.      . VOLTAREN 1 % GEL        No current facility-administered medications for this visit.    Allergies:    Allergies  Allergen Reactions  . Aspirin Other (See Comments)    GI distress  . Augmentin [Amoxicillin-Pot Clavulanate] Other (See Comments)    unknown  . Erythromycin Nausea And Vomiting  . Levaquin [Levofloxacin In D5w] Diarrhea    Social History:  The patient  reports that she has never smoked. She does not have any smokeless tobacco history on file. She reports that she does not drink alcohol or use illicit drugs.   ROS:  Please see the history of present illness.   Occasional diarrhea. No syncope, no dizziness, no orthopnea, no PND. Improved symptoms.    PHYSICAL EXAM: VS:  BP 118/64  Pulse 80  Ht 5\' 2"  (1.575 m)  Wt 179 lb 6.4 oz (81.375 kg)  BMI 32.80 kg/m2 Well nourished, well developed, in no acute distress HEENT: normal Neck: no JVD Cardiac:  normal S1, S2; RRR; 1/6 S murmur Lungs:  clear to auscultation bilaterally, no wheezing, rhonchi or rales Abd: soft, nontender, no hepatomegalyOverweight Ext: no edema Skin: warm and dry Neuro: no focal abnormalities noted  EKG:  None today. Cardiac catheterization reviewed as above. Reassuring.    LABS: Creat 0.7, LDL 69, K 4.4  ASSESSMENT AND PLAN:  1. Nonischemic cardiomyopathy/chronic systolic heart failure-discussed medications. Carvedilol to 12.5 mg twice a day. Could not tolerate 25mg  BID.  Could not tolerate increase in lisinopril to 10 mg. We will keep that 5.  On Aldactone. 2. Obesity-encourage weight loss. Watching Lasix every other day. She may be able to take Lasix less often. Doing well. I think this will help with her energy 3. Tachycardia-improved. PAT.  Continue with digoxin, increasing carvedilol. This was likely mediated by cardiomyopathy. 4. RTC in 4 months  Signed, Candee Furbish, MD [facility]  12/11/2013 10:32 AM

## 2013-12-11 NOTE — Patient Instructions (Signed)
The current medical regimen is effective;  continue present plan and medications.  Follow up in 4 months with Dr Skains. 

## 2014-02-17 ENCOUNTER — Other Ambulatory Visit: Payer: Self-pay | Admitting: Cardiology

## 2014-03-20 ENCOUNTER — Other Ambulatory Visit: Payer: Self-pay

## 2014-03-20 MED ORDER — DIGOXIN 125 MCG PO TABS: 0.1250 mg | ORAL_TABLET | Freq: Every day | ORAL | Status: DC

## 2014-03-20 MED ORDER — SPIRONOLACTONE 25 MG PO TABS: 25.0000 mg | ORAL_TABLET | Freq: Every day | ORAL | Status: DC

## 2014-04-04 ENCOUNTER — Other Ambulatory Visit: Payer: Self-pay | Admitting: Cardiology

## 2014-04-10 ENCOUNTER — Other Ambulatory Visit: Payer: Self-pay | Admitting: Cardiology

## 2014-04-10 MED ORDER — LISINOPRIL 5 MG PO TABS: ORAL_TABLET | ORAL | Status: DC

## 2014-04-11 ENCOUNTER — Other Ambulatory Visit: Payer: Self-pay

## 2014-04-11 DIAGNOSIS — Z1231 Encounter for screening mammogram for malignant neoplasm of breast: Secondary | ICD-10-CM

## 2014-05-05 ENCOUNTER — Encounter: Payer: Self-pay | Admitting: Cardiology

## 2014-05-12 ENCOUNTER — Other Ambulatory Visit: Payer: Self-pay | Admitting: *Deleted

## 2014-05-12 ENCOUNTER — Encounter: Payer: Self-pay | Admitting: Cardiology

## 2014-05-12 ENCOUNTER — Ambulatory Visit (INDEPENDENT_AMBULATORY_CARE_PROVIDER_SITE_OTHER): Payer: Medicare Other | Admitting: Cardiology

## 2014-05-12 VITALS — BP 114/60 | HR 75 | Ht 62.0 in | Wt 178.0 lb

## 2014-05-12 DIAGNOSIS — E669 Obesity, unspecified: Secondary | ICD-10-CM

## 2014-05-12 DIAGNOSIS — E785 Hyperlipidemia, unspecified: Secondary | ICD-10-CM

## 2014-05-12 DIAGNOSIS — I471 Supraventricular tachycardia: Secondary | ICD-10-CM

## 2014-05-12 DIAGNOSIS — I420 Dilated cardiomyopathy: Secondary | ICD-10-CM

## 2014-05-12 DIAGNOSIS — R000 Tachycardia, unspecified: Secondary | ICD-10-CM

## 2014-05-12 DIAGNOSIS — I5022 Chronic systolic (congestive) heart failure: Secondary | ICD-10-CM

## 2014-05-12 MED ORDER — LISINOPRIL 5 MG PO TABS: 5.0000 mg | ORAL_TABLET | Freq: Every day | ORAL | Status: DC

## 2014-05-12 NOTE — Progress Notes (Signed)
[facility]. [address] [place], [place]  [address] Phone: [phone] Fax:  [phone]  Date:  05/12/2014   ID:  Brittney Malone, DOB 05/21/1941, MRN [id number]  PCP:  [name], MD   History of Present Illness: Brittney Malone is a 73 y.o. female with dilated cardiomyopathy, nonischemic, EF 30% heart catheterization 04/02/13 here for followup. Possible viral etiology. Attempted to up titrate medications, could not tolerate higher dose of carvedilol or lisinopril. No dyspnea. No orthopnea. Low energy. 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 Lasix every other day.   She could not tolerate 25 mg of carvedilol twice a day. She felt fatigued as well as some chest discomfort with this she states. She's also had intermittent diarrhea. No significant shortness of breath currently. Her son has a cardiomyopathy as well.  She has a robust response to Lasix. She is taking this every other day.  Prior recurrent bouts of pneumonia.    Wt Readings from Last 3 Encounters:  05/12/14 178 lb (80.74 kg)  12/11/13 179 lb 6.4 oz (81.375 kg)  10/11/13 179 lb (81.194 kg)     Past Medical History  Diagnosis Date  . Hypertension   . History of nuclear stress test     told results were WNL, - several yrs. ago    . Hypothyroidism   . Diabetes mellitus   . Neuromuscular disorder     neuropathy- feet- bilateral   . Arthritis     knees- OA, uses topical treatment   . Restless leg syndrome   . Osteoarthritis     Past Surgical History  Procedure Laterality Date  . Abdominal hysterectomy    . Eye surgery      cataracts (bilateral) - removed- /w IOL   . Tonsillectomy      as a youngster  . Appendectomy    . Maxillary antrostomy  02/02/2012    Procedure: MAXILLARY ANTROSTOMY;  Surgeon: Ruby Cola, MD;  Location: [facility] OR;  Service: ENT;  Laterality: Left;  Left Maxillary Antrostomy [id number]  . Sphenoidectomy  02/02/2012    Procedure: SPHENOIDECTOMY;  Surgeon:  Ruby Cola, MD;  Location: [facility];  Service: ENT;  Laterality: Left;  Left Sphenoidectomy [phone]  . Nasal sinus surgery  02/02/2012    Procedure: ENDOSCOPIC SINUS SURGERY WITH STEALTH;  Surgeon: Ruby Cola, MD;  Location: [facility] OR;  Service: ENT;  Laterality: Left;  Left Frontal Sinusotomy [id number], and endoscopic resection of left anterior cranial fossa tumor [id number]  . Ethmoidectomy  02/02/2012    Procedure: ETHMOIDECTOMY;  Surgeon: Ruby Cola, MD;  Location: [facility];  Service: ENT;  Laterality: Left;  Left Ethmoidectomy [phone] with repair of CSF leak  . Carpal tunnel repair    . Knee cartilage surgery Left   . Cataract extraction, bilateral    . Other      Hemangiopericytoma, left nasal septum,repair of CSF leak    Current Outpatient Prescriptions  Medication Sig Dispense Refill  . acetaminophen (TYLENOL) 500 MG tablet Take 1,000 mg by mouth every 6 (six) hours as needed for pain.    . Calcium Carbonate-Vitamin D (CALCIUM 600/VITAMIN D PO) Take 1 tablet by mouth daily.    . carvedilol (COREG) 12.5 MG tablet TAKE 1 TABLET (12.5 MG TOTAL) BY MOUTH 2 (TWO) TIMES DAILY WITH A MEAL. 60 tablet 2  . Cholecalciferol (VITAMIN D-3 PO) Take 1,000 Units by mouth daily.     . digoxin (LANOXIN) 0.125 MG tablet Take  1 tablet (0.125 mg total) by mouth daily. 90 tablet 3  . furosemide (LASIX) 20 MG tablet Take 1 tablet (20 mg total) by mouth every other day. 30 tablet 5  . glipiZIDE-metformin (METAGLIP) 5-500 MG per tablet Take 2 tablets by mouth 2 (two) times daily before a meal.     . KLOR-CON 10 10 MEQ tablet Take 20 mEq by mouth daily.  6  . levothyroxine (SYNTHROID, [name]    . lisinopril (PRINIVIL,ZESTRIL) 5 MG tablet TAKE 1 TABLET(10 MG TOTAL) BY MOUTH DAILY. (Patient taking differently: Take 5 mg by mouth daily. TAKE 1 TABLET(10 MG TOTAL) BY MOUTH DAILY.) 30 tablet 0  . Misc Natural Products (OSTEO BI-FLEX JOINT SHIELD PO) Take by mouth.     . Omega-3 Fatty Acids (FISH OIL) 1200 MG CAPS Take 1,200 mg by mouth daily.     . Plant Sterols and Stanols (CHOLEST OFF) 450 MG TABS Take 450 mg by mouth 2 (two) times daily.     Marland Kitchen SALINE NASAL SPRAY NA Place 1 spray into both nostrils daily as needed (allergies).    Marland Kitchen spironolactone (ALDACTONE) 25 MG tablet Take 1 tablet (25 mg total) by mouth daily. 90 tablet 3  . VOLTAREN 1 % GEL      No current facility-administered medications for this visit.    Allergies:    Allergies  Allergen Reactions  . Aspirin Other (See Comments)    GI distress  . Augmentin [Amoxicillin-Pot Clavulanate] Other (See Comments)    unknown  . Erythromycin Nausea And Vomiting  . Levaquin [Levofloxacin In D5w] Diarrhea    Social History:  The patient  reports that she has never smoked. She does not have any smokeless tobacco history on file. She reports that she does not drink alcohol or use illicit drugs.   ROS:  Please see the history of present illness.   Occasional diarrhea. No syncope, no dizziness, no orthopnea, no PND. Improved symptoms.    PHYSICAL EXAM: VS:  BP 114/60 mmHg  Pulse 75  Ht 5\' 2"  (1.575 m)  Wt 178 lb (80.74 kg)  BMI 32.55 kg/m2 Well nourished, well developed, in no acute distress HEENT: normal Neck: no JVD Cardiac:  normal S1, S2; RRR; 1/6 S murmur Lungs:  clear to auscultation bilaterally, no wheezing, rhonchi or rales Abd: soft, nontender, no hepatomegalyOverweight Ext: no edema Skin: warm and dry Neuro: no focal abnormalities noted  EKG:  05/12/14-sinus rhythm, 75, nonspecific ST-T wave changes, low voltage Cardiac catheterization reviewed as above. Reassuring.     LABS: 5/14: Creat 0.7, LDL 69, K 4.4 Echocardiogram: 06/21/13-EF 25%.  ASSESSMENT AND PLAN:  1. Nonischemic cardiomyopathy/chronic systolic heart failure-EF 25% range, discussed medications. Carvedilol to 12.5 mg twice a day. Could not tolerate 25mg  BID.  Could not tolerate increase in lisinopril to 10 mg. We  will keep that 5.  On Aldactone. She has had extensive lab work recently done by Dr. Kenton Kingfisher. I do not have those results available at this time. With her knee pain, try to avoid Aleve. 2. Obesity-encourage weight loss. Taking Lasix every other day. She may be able to take Lasix less often. She has the liver liberty to adjust her Lasix with her weight. Doing well. 3. Tachycardia-improved. PAT. Continue with digoxin, carvedilol. This was likely mediated by cardiomyopathy. 4. RTC in 6 months  Signed, Candee Furbish, MD [facility]  05/12/2014 10:41 AM

## 2014-05-12 NOTE — Patient Instructions (Signed)
The current medical regimen is effective;  continue present plan and medications.  Follow up in 6 months with Dr. Skains.  You will receive a letter in the mail 2 months before you are due.  Please call us when you receive this letter to schedule your follow up appointment.  

## 2014-05-15 ENCOUNTER — Encounter (HOSPITAL_COMMUNITY): Payer: Self-pay | Admitting: Cardiology

## 2014-05-19 ENCOUNTER — Ambulatory Visit
Admission: RE | Admit: 2014-05-19 | Discharge: 2014-05-19 | Disposition: A | Discharge status: Home / Self Care | Payer: Medicare Other | Source: Ambulatory Visit

## 2014-05-19 DIAGNOSIS — Z1231 Encounter for screening mammogram for malignant neoplasm of breast: Secondary | ICD-10-CM

## 2014-07-04 ENCOUNTER — Other Ambulatory Visit: Payer: Self-pay | Admitting: Cardiology

## 2014-10-06 ENCOUNTER — Telehealth: Payer: Self-pay | Admitting: Cardiology

## 2014-10-06 NOTE — Telephone Encounter (Signed)
Spoke with pt who reports Friday she had lower back pain.  On Saturday her right upper leg started hurting.  She reports it is a throbbing ache in the front of her thigh area.  She denies any swelling, warmth, redness or streaking.  She has been using a heating pad on it.  She has not taken anything for the pain other than Tylenol and she reports this doesn't help really.  She has an appt tomorrow with her PCP Dr Kenton Kingfisher.  She will call back if she has further needs once she sees him.

## 2014-10-06 NOTE — Telephone Encounter (Signed)
New problem   Pt is having rt upper leg pain since Saturday and want to speak to nurse.

## 2014-10-07 ENCOUNTER — Ambulatory Visit
Admission: RE | Admit: 2014-10-07 | Discharge: 2014-10-07 | Disposition: A | Discharge status: Home / Self Care | Payer: Medicare Other | Source: Ambulatory Visit | Attending: Family Medicine | Admitting: Family Medicine

## 2014-10-07 ENCOUNTER — Other Ambulatory Visit: Payer: Self-pay | Admitting: Family Medicine

## 2014-10-07 DIAGNOSIS — M79651 Pain in right thigh: Secondary | ICD-10-CM

## 2014-10-07 NOTE — Telephone Encounter (Signed)
Agree with plan.  Demi Trieu, MD  

## 2014-10-07 NOTE — Telephone Encounter (Signed)
Called pt back to follow up on seeing Dr Brittney Malone today for leg pain.  He sent her for a u/s of her leg.  There was no DVT.  She will follow up as instructed.

## 2014-11-06 ENCOUNTER — Encounter: Payer: Self-pay | Admitting: Cardiology

## 2014-11-06 ENCOUNTER — Ambulatory Visit (INDEPENDENT_AMBULATORY_CARE_PROVIDER_SITE_OTHER): Payer: Medicare Other | Admitting: Cardiology

## 2014-11-06 VITALS — BP 116/66 | HR 72 | Ht 62.0 in | Wt 173.0 lb

## 2014-11-06 DIAGNOSIS — E785 Hyperlipidemia, unspecified: Secondary | ICD-10-CM

## 2014-11-06 DIAGNOSIS — I471 Supraventricular tachycardia: Secondary | ICD-10-CM

## 2014-11-06 DIAGNOSIS — E669 Obesity, unspecified: Secondary | ICD-10-CM | POA: Diagnosis not present

## 2014-11-06 DIAGNOSIS — I5022 Chronic systolic (congestive) heart failure: Secondary | ICD-10-CM | POA: Diagnosis not present

## 2014-11-06 MED ORDER — SACUBITRIL-VALSARTAN 24-26 MG PO TABS: 1.0000 | ORAL_TABLET | Freq: Two times a day (BID) | ORAL | Status: DC

## 2014-11-06 NOTE — Progress Notes (Signed)
[facility]. [address] [place], [place]  [address] Phone: [phone] Fax:  [phone]  Date:  11/06/2014   ID:  Brittney Malone, DOB August 19, 1940, MRN [id number]  PCP:  [name], MD   History of Present Illness: Brittney Malone is a 74 y.o. female with dilated cardiomyopathy, nonischemic, EF 30% heart catheterization 04/02/13 here for followup. Possible viral etiology. Attempted to up titrate medications, could not tolerate higher dose of carvedilol or lisinopril. No dyspnea. No orthopnea. Low energy. 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 Lasix every other day.   She could not tolerate 25 mg of carvedilol twice a day. She felt fatigued as well as some chest discomfort with this she states. She's also had intermittent diarrhea. No significant shortness of breath currently. Her son has a cardiomyopathy as well.  She has had a robust response to Lasix. She is taking this every other day.  Prior recurrent bouts of pneumonia.   11/06/14 - arthritis in knees. Cortisone. Breath ok. No cp, syncope. Recent cold. Both of her sons have heart failure. They are seen in HF clinic   Wt Readings from Last 3 Encounters:  11/06/14 173 lb (78.472 kg)  05/12/14 178 lb (80.74 kg)  12/11/13 179 lb 6.4 oz (81.375 kg)     Past Medical History  Diagnosis Date  . Hypertension   . History of nuclear stress test     told results were WNL, - several yrs. ago    . Hypothyroidism   . Diabetes mellitus   . Neuromuscular disorder     neuropathy- feet- bilateral   . Arthritis     knees- OA, uses topical treatment   . Restless leg syndrome   . Osteoarthritis     Past Surgical History  Procedure Laterality Date  . Abdominal hysterectomy    . Eye surgery      cataracts (bilateral) - removed- /w IOL   . Tonsillectomy      as a youngster  . Appendectomy    . Maxillary antrostomy  02/02/2012    Procedure: MAXILLARY ANTROSTOMY;  Surgeon: Ruby Cola, MD;  Location:  [facility] OR;  Service: ENT;  Laterality: Left;  Left Maxillary Antrostomy [id number]  . Sphenoidectomy  02/02/2012    Procedure: SPHENOIDECTOMY;  Surgeon: Ruby Cola, MD;  Location: [facility];  Service: ENT;  Laterality: Left;  Left Sphenoidectomy [phone]  . Nasal sinus surgery  02/02/2012    Procedure: ENDOSCOPIC SINUS SURGERY WITH STEALTH;  Surgeon: Ruby Cola, MD;  Location: [facility] OR;  Service: ENT;  Laterality: Left;  Left Frontal Sinusotomy [id number], and endoscopic resection of left anterior cranial fossa tumor [id number]  . Ethmoidectomy  02/02/2012    Procedure: ETHMOIDECTOMY;  Surgeon: Ruby Cola, MD;  Location: [facility];  Service: ENT;  Laterality: Left;  Left Ethmoidectomy [phone] with repair of CSF leak  . Carpal tunnel repair    . Knee cartilage surgery Left   . Cataract extraction, bilateral    . Other      Hemangiopericytoma, left nasal septum,repair of CSF leak  . Left heart catheterization with coronary angiogram N/A 04/02/2013    Procedure: LEFT HEART CATHETERIZATION WITH CORONARY ANGIOGRAM;  Surgeon: Candee Furbish, MD;  Location: [facility] C[facility];  Service: Cardiovascular;  Laterality: N/A;    Current Outpatient Prescriptions  Medication Sig Dispense Refill  . acetaminophen (TYLENOL) 500 MG tablet Take 1,000 mg by mouth every 6 (six) hours as needed for pain.    Marland Kitchen  carvedilol (COREG) 12.5 MG tablet TAKE 1 TABLET (12.5 MG TOTAL) BY MOUTH 2 (TWO) TIMES DAILY WITH A MEAL. 60 tablet 4  . Cholecalciferol (VITAMIN D-3 PO) Take 1,000 Units by mouth daily.     . digoxin (LANOXIN) 0.125 MG tablet Take 1 tablet (0.125 mg total) by mouth daily. 90 tablet 3  . glipiZIDE (GLUCOTROL) 5 MG tablet Take 10 mg by mouth 2 (two) times daily.    Marland Kitchen KLOR-CON 10 10 MEQ tablet Take 20 mEq by mouth daily.   6  . levothyroxine (SYNTHROID, [name]    . lisinopril (PRINIVIL,ZESTRIL) 5 MG tablet Take 1 tablet (5 mg total) by mouth daily. 90 tablet 3  . metFORMIN  (GLUCOPHAGE) 500 MG tablet Take 1,000 mg by mouth 2 (two) times daily with a meal.    . Misc Natural Products (OSTEO BI-FLEX JOINT SHIELD PO) Take by mouth.    . Omega-3 Fatty Acids (FISH OIL) 1200 MG CAPS Take 1,200 mg by mouth daily.     . Plant Sterols and Stanols (CHOLEST OFF) 450 MG TABS Take 450 mg by mouth 2 (two) times daily.     Marland Kitchen SALINE NASAL SPRAY NA Place 1 spray into both nostrils daily as needed (allergies).    Marland Kitchen spironolactone (ALDACTONE) 25 MG tablet Take 1 tablet (25 mg total) by mouth daily. 90 tablet 3   No current facility-administered medications for this visit.    Allergies:    Allergies  Allergen Reactions  . Aspirin Other (See Comments)    GI distress  . Augmentin [Amoxicillin-Pot Clavulanate] Other (See Comments)    unknown  . Erythromycin Nausea And Vomiting  . Levaquin [Levofloxacin In D5w] Diarrhea    Social History:  The patient  reports that she has never smoked. She does not have any smokeless tobacco history on file. She reports that she does not drink alcohol or use illicit drugs.   ROS:  Please see the history of present illness.   Occasional diarrhea. No syncope, no dizziness, no orthopnea, no PND. Improved symptoms.    PHYSICAL EXAM: VS:  BP 116/66 mmHg  Pulse 72  Ht 5\' 2"  (1.575 m)  Wt 173 lb (78.472 kg)  BMI 31.63 kg/m2 Well nourished, well developed, in no acute distress HEENT: normal Neck: no JVD Cardiac:  normal S1, S2; RRR; 1/6 S murmur Lungs:  clear to auscultation bilaterally, no wheezing, rhonchi or rales Abd: soft, nontender, no hepatomegalyOverweight Ext: no edema Skin: warm and dry Neuro: no focal abnormalities noted  EKG:  05/12/14-sinus rhythm, 75, nonspecific ST-T wave changes, low voltage Cardiac catheterization reviewed as above. Reassuring.     LABS: 5/14: Creat 0.7, LDL 69, K 4.4 Echocardiogram: 06/21/13-EF 25%.  ASSESSMENT AND PLAN:  1. Nonischemic cardiomyopathy/chronic systolic heart failure-EF 25% range,  discussed medications. Carvedilol to 12.5 mg twice a day. Could not tolerate 25mg  BID.  Could not tolerate increase in lisinopril to 10 mg. On 5.  On Aldactone. She is not currently taking any furosemide. Her weight is maintaining well in fact down a few pounds. She is still having fatigue and shortness of breath with activity. Both of her sons have heart failure and her taken care of by the advanced heart failure clinic. She questioned if she could try Entresto and I think this is a good idea for her. We will transition her off of her lisinopril 5 mg and start this medication 36 hours later. If she  begins to feel any dizziness or has hypotension, Aldactone will be the first to remove. We will check her back in clinic in 1-2 weeks and check lab work at that time. She has been tolerating her spironolactone well with ACE inhibitor combination.  With her knee pain, try to avoid Aleve. 2. Obesity-encourage weight loss.  Doing well. 3. Tachycardia-improved. PAT. Continue with digoxin, carvedilol. Stopping digoxin. This was likely mediated by cardiomyopathy. 4. RTC in 1-2 weeks  Signed, Candee Furbish, MD [facility]  11/06/2014 9:00 AM

## 2014-11-06 NOTE — Patient Instructions (Addendum)
Medication Instructions:  Please stop Lisinopril. Please stop Digoxin. Start Entresto 24/26 mg twice a day - 36 hours after your last dose of Lisinopril. Please call with any side effects such as dizziness or low blood pressure. Continue all other medications as listed.  Follow-Up: Follow up in 2 weeks with Dr Marlou Porch.  Thank you for choosing [facility]!!

## 2014-11-11 ENCOUNTER — Telehealth: Payer: Self-pay | Admitting: Cardiology

## 2014-11-11 NOTE — Telephone Encounter (Signed)
Patient's insurance will not cover Entresol.  Patient was told to stop her lisinopril and digoxin at last office visit. Patient is still taking her lisinopril and digoxin, because she did not start Entresol. Will forward Dr. Marlou Porch for further instructions and advisement.

## 2014-11-11 NOTE — Telephone Encounter (Signed)
New problem   Pt was told by doctor skains to stop taking lisinopril and digoxin because of taking Entresol so b/c pt isn't taking the entresol she want to know do she need to start back taking these meds.

## 2014-11-11 NOTE — Telephone Encounter (Signed)
In that case, let go back to the lisinopril that she was on previously. We will stay off of digoxin. Candee Furbish, MD

## 2014-11-12 NOTE — Telephone Encounter (Signed)
Called patient this am to let her know it was fine to take her lisinopril, but to stop taking her digoxin. Patient verbalized understanding.

## 2014-11-19 ENCOUNTER — Ambulatory Visit: Payer: Medicare Other | Admitting: Cardiology

## 2014-12-01 ENCOUNTER — Other Ambulatory Visit: Payer: Self-pay

## 2014-12-09 ENCOUNTER — Other Ambulatory Visit: Payer: Self-pay | Admitting: Cardiology

## 2014-12-09 ENCOUNTER — Other Ambulatory Visit: Payer: Self-pay

## 2014-12-09 MED ORDER — CARVEDILOL 12.5 MG PO TABS: ORAL_TABLET | ORAL | Status: DC

## 2014-12-10 ENCOUNTER — Other Ambulatory Visit: Payer: Self-pay

## 2014-12-10 MED ORDER — CARVEDILOL 12.5 MG PO TABS: ORAL_TABLET | ORAL | Status: DC

## 2015-03-02 ENCOUNTER — Ambulatory Visit (INDEPENDENT_AMBULATORY_CARE_PROVIDER_SITE_OTHER): Payer: Medicare Other | Admitting: Cardiology

## 2015-03-02 ENCOUNTER — Encounter: Payer: Self-pay | Admitting: Cardiology

## 2015-03-02 VITALS — BP 120/70 | HR 80 | Ht 62.0 in | Wt 180.0 lb

## 2015-03-02 DIAGNOSIS — I420 Dilated cardiomyopathy: Secondary | ICD-10-CM

## 2015-03-02 DIAGNOSIS — E669 Obesity, unspecified: Secondary | ICD-10-CM

## 2015-03-02 DIAGNOSIS — I5022 Chronic systolic (congestive) heart failure: Secondary | ICD-10-CM | POA: Diagnosis not present

## 2015-03-02 DIAGNOSIS — E785 Hyperlipidemia, unspecified: Secondary | ICD-10-CM

## 2015-03-02 DIAGNOSIS — I471 Supraventricular tachycardia: Secondary | ICD-10-CM

## 2015-03-02 MED ORDER — SACUBITRIL-VALSARTAN 24-26 MG PO TABS: 1.0000 | ORAL_TABLET | Freq: Two times a day (BID) | ORAL | Status: DC

## 2015-03-02 NOTE — Patient Instructions (Signed)
Medication Instructions:  Please stop your Digoxin and Lisinopril.  Start Entresto 24-26 mg twice day 36 hours after stopping the Lisinopril. Continue all other medications as listed.  Testing/Procedures: Your physician has requested that you have an echocardiogram one month after starting Entresto. Echocardiography is a painless test that uses sound waves to create images of your heart. It provides your doctor with information about the size and shape of your heart and how well your heart's chambers and valves are working. This procedure takes approximately one hour. There are no restrictions for this procedure.  Follow-Up: Follow up in 3 months with Dr. Marlou Porch.  You will receive a letter in the mail 2 months before you are due.  Please call us when you receive this letter to schedule your follow up appointment.  Thank you for choosing [facility]!!

## 2015-03-02 NOTE — Progress Notes (Signed)
[facility]. [address] [place], [place]  [address] Phone: [phone] Fax:  [phone]  Date:  03/02/2015   ID:  Brittney Malone, DOB 1940-10-30, MRN [id number]  PCP:  [name], MD   History of Present Illness: Brittney Malone is a 74 y.o. female with dilated cardiomyopathy, nonischemic, EF 30% heart catheterization 04/02/13 here for followup. Possible viral etiology. Attempted to up titrate medications, could not tolerate higher dose of carvedilol or lisinopril. No dyspnea. No orthopnea. Low energy. 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 Lasix every other day.   She could not tolerate 25 mg of carvedilol twice a day. She felt fatigued as well as some chest discomfort with this she states. She's also had intermittent diarrhea. No significant shortness of breath currently. Her son has a cardiomyopathy as well.  She has had a robust response to Lasix. She is taking this every other day.  Prior recurrent bouts of pneumonia.   11/06/14 - arthritis in knees. Cortisone. Breath ok. No cp, syncope. Recent cold. Both of her sons have heart failure. They are seen in HF clinic  03/02/15-she is willing once again to try the [facility]. See below. Still feels fatigue, weakness. No syncope, mild shortness of breath with activity. Both of her sons are on this medication and feels so much better.   Wt Readings from Last 3 Encounters:  03/02/15 180 lb (81.647 kg)  11/06/14 173 lb (78.472 kg)  05/12/14 178 lb (80.74 kg)     Past Medical History  Diagnosis Date  . Hypertension   . History of nuclear stress test     told results were WNL, - several yrs. ago    . Hypothyroidism   . Diabetes mellitus   . Neuromuscular disorder     neuropathy- feet- bilateral   . Arthritis     knees- OA, uses topical treatment   . Restless leg syndrome   . Osteoarthritis     Past Surgical History  Procedure Laterality Date  . Abdominal hysterectomy    . Eye surgery     cataracts (bilateral) - removed- /w IOL   . Tonsillectomy      as a youngster  . Appendectomy    . Maxillary antrostomy  02/02/2012    Procedure: MAXILLARY ANTROSTOMY;  Surgeon: Ruby Cola, MD;  Location: [facility] OR;  Service: ENT;  Laterality: Left;  Left Maxillary Antrostomy [id number]  . Sphenoidectomy  02/02/2012    Procedure: SPHENOIDECTOMY;  Surgeon: Ruby Cola, MD;  Location: [facility];  Service: ENT;  Laterality: Left;  Left Sphenoidectomy [id number]  . Nasal sinus surgery  02/02/2012    Procedure: ENDOSCOPIC SINUS SURGERY WITH STEALTH;  Surgeon: Ruby Cola, MD;  Location: [facility] OR;  Service: ENT;  Laterality: Left;  Left Frontal Sinusotomy [id number], and endoscopic resection of left anterior cranial fossa tumor [id number]  . Ethmoidectomy  02/02/2012    Procedure: ETHMOIDECTOMY;  Surgeon: Ruby Cola, MD;  Location: [facility];  Service: ENT;  Laterality: Left;  Left Ethmoidectomy [phone] with repair of CSF leak  . Carpal tunnel repair    . Knee cartilage surgery Left   . Cataract extraction, bilateral    . Other      Hemangiopericytoma, left nasal septum,repair of CSF leak  . Left heart catheterization with coronary angiogram N/A 04/02/2013    Procedure: LEFT HEART CATHETERIZATION WITH CORONARY ANGIOGRAM;  Surgeon: Candee Furbish, MD;  Location: [facility] CATH LAB;  Service: Cardiovascular;  Laterality: N/A;  Current Outpatient Prescriptions  Medication Sig Dispense Refill  . acetaminophen (TYLENOL) 500 MG tablet Take 1,000 mg by mouth every 6 (six) hours as needed for pain.    . carvedilol (COREG) 12.5 MG tablet TAKE 1 TABLET (12.5 MG TOTAL) BY MOUTH 2 (TWO) TIMES DAILY WITH A MEAL. 60 tablet 4  . Cholecalciferol (VITAMIN D-3 PO) Take 1,000 Units by mouth daily.     . digoxin (LANOXIN) 0.125 MG tablet TAKE 1 TABLET (0.125 MG TOTAL) BY MOUTH DAILY.  3  . gabapentin (NEURONTIN) 100 MG capsule Take 100 mg by mouth daily.    Marland Kitchen glipiZIDE (GLUCOTROL) 10 MG tablet 1 TABLET TWICE A DAY FOR DIABETES ORALLY 90 DAYS  3  .  KLOR-CON 10 10 MEQ tablet Take 20 mEq by mouth daily.   6  . levothyroxine (SYNTHROID, [name]    . lisinopril (PRINIVIL,ZESTRIL) 5 MG tablet Take 5 mg by mouth daily.  2  . metFORMIN (GLUCOPHAGE) 500 MG tablet Take 1,000 mg by mouth 2 (two) times daily with a meal.    . Misc Natural Products (OSTEO BI-FLEX JOINT SHIELD PO) Take by mouth.    . Omega-3 Fatty Acids (FISH OIL) 1200 MG CAPS Take 1,200 mg by mouth daily.     . Plant Sterols and Stanols (CHOLEST OFF) 450 MG TABS Take 450 mg by mouth 2 (two) times daily.     Marland Kitchen SALINE NASAL SPRAY NA Place 1 spray into both nostrils daily as needed (allergies).    Marland Kitchen spironolactone (ALDACTONE) 25 MG tablet Take 1 tablet (25 mg total) by mouth daily. 90 tablet 3   No current facility-administered medications for this visit.    Allergies:    Allergies  Allergen Reactions  . Aspirin Other (See Comments)    GI distress  . Augmentin [Amoxicillin-Pot Clavulanate] Other (See Comments)    unknown  . Erythromycin Nausea And Vomiting  . Levaquin [Levofloxacin In D5w] Diarrhea    Social History:  The patient  reports that she has never smoked. She does not have any smokeless tobacco history on file. She reports that she does not drink alcohol or use illicit drugs.   ROS:  Please see the history of present illness.   Occasional diarrhea. No syncope, no dizziness, no orthopnea, no PND. Improved symptoms.    PHYSICAL EXAM: VS:  BP 120/70 mmHg  Pulse 80  Ht 5\' 2"  (1.575 m)  Wt 180 lb (81.647 kg)  BMI 32.91 kg/m2 Well nourished, well developed, in no acute distress HEENT: normal Neck: no JVD Cardiac:  normal S1, S2; RRR; 1/6 S murmur Lungs:  clear to auscultation bilaterally, no wheezing, rhonchi or rales Abd: soft, nontender, no hepatomegalyOverweight Ext: no edema Skin: warm and dry Neuro: no focal abnormalities noted  EKG:  05/12/14-sinus rhythm, 75, nonspecific ST-T wave changes,  low voltage Cardiac catheterization reviewed as above. Reassuring.     LABS: 5/14: Creat 0.7, LDL 69, K 4.4 Echocardiogram: 06/21/13-EF 25%.  ASSESSMENT AND PLAN:  1. Nonischemic cardiomyopathy/chronic systolic heart failure-EF 25% range, discussed medications. Carvedilol to 12.5 mg twice a day. Could not tolerate 25mg  BID.  Could not tolerate increase in lisinopril to 10 mg. On 5.  On Aldactone. She is not currently taking any furosemide. Her weight is maintaining. She is still having fatigue and shortness of breath with activity. Both of her sons have heart failure and her taken care of by the advanced heart failure clinic.  She questioned if she could try Entresto and I think this is a good idea for her. We tried this last time, cost was too high. She now states that she had a letter from [employer] stating that her co-pay would be $90 a month. She is willing to try once again. We will transition her off of her lisinopril 5 mg and start this medication 36 hours later. If she begins to feel any dizziness or has hypotension, Aldactone will be the first to remove. We will check her back in clinic in 1-2 weeks and check lab work at that time. She has been tolerating her spironolactone well with ACE inhibitor combination.  With her knee pain, try to avoid Aleve. 2. Obesity-encourage weight loss.   3. Tachycardia-improved. PAT.  carvedilol. Stoping digoxin. This was likely mediated by cardiomyopathy. 4. RTC in 1-2 weeks  Signed, Candee Furbish, MD [facility]  03/02/2015 9:23 AM

## 2015-03-09 ENCOUNTER — Other Ambulatory Visit: Payer: Self-pay | Admitting: Cardiology

## 2015-04-20 ENCOUNTER — Other Ambulatory Visit: Payer: Self-pay

## 2015-04-20 ENCOUNTER — Ambulatory Visit (HOSPITAL_COMMUNITY): Payer: Medicare Other | Attending: Cardiovascular Disease

## 2015-04-20 DIAGNOSIS — I351 Nonrheumatic aortic (valve) insufficiency: Secondary | ICD-10-CM | POA: Insufficient documentation

## 2015-04-20 DIAGNOSIS — Z8249 Family history of ischemic heart disease and other diseases of the circulatory system: Secondary | ICD-10-CM | POA: Insufficient documentation

## 2015-04-20 DIAGNOSIS — I10 Essential (primary) hypertension: Secondary | ICD-10-CM | POA: Diagnosis not present

## 2015-04-20 DIAGNOSIS — E119 Type 2 diabetes mellitus without complications: Secondary | ICD-10-CM | POA: Insufficient documentation

## 2015-04-20 DIAGNOSIS — E785 Hyperlipidemia, unspecified: Secondary | ICD-10-CM | POA: Diagnosis not present

## 2015-04-20 DIAGNOSIS — I429 Cardiomyopathy, unspecified: Secondary | ICD-10-CM | POA: Insufficient documentation

## 2015-04-20 DIAGNOSIS — I420 Dilated cardiomyopathy: Secondary | ICD-10-CM | POA: Diagnosis not present

## 2015-05-06 ENCOUNTER — Other Ambulatory Visit: Payer: Self-pay | Admitting: Cardiology

## 2015-06-11 ENCOUNTER — Ambulatory Visit (INDEPENDENT_AMBULATORY_CARE_PROVIDER_SITE_OTHER): Payer: Medicare Other | Admitting: Cardiology

## 2015-06-11 ENCOUNTER — Encounter: Payer: Self-pay | Admitting: Cardiology

## 2015-06-11 VITALS — BP 112/68 | HR 88 | Ht 62.0 in | Wt 181.8 lb

## 2015-06-11 DIAGNOSIS — I471 Supraventricular tachycardia: Secondary | ICD-10-CM | POA: Diagnosis not present

## 2015-06-11 DIAGNOSIS — I5022 Chronic systolic (congestive) heart failure: Secondary | ICD-10-CM

## 2015-06-11 DIAGNOSIS — I420 Dilated cardiomyopathy: Secondary | ICD-10-CM | POA: Diagnosis not present

## 2015-06-11 LAB — BASIC METABOLIC PANEL
BUN: 13 mg/dL (ref 7–25)
CO2: 23 mmol/L (ref 20–31)
Calcium: 9 mg/dL (ref 8.6–10.4)
Chloride: 102 mmol/L (ref 98–110)
Creat: 0.78 mg/dL (ref 0.60–0.93)
Glucose, Bld: 229 mg/dL — ABNORMAL HIGH (ref 65–99)
Potassium: 4.5 mmol/L (ref 3.5–5.3)
Sodium: 136 mmol/L (ref 135–146)

## 2015-06-11 MED ORDER — CARVEDILOL 12.5 MG PO TABS: 12.5000 mg | ORAL_TABLET | Freq: Two times a day (BID) | ORAL | Status: DC

## 2015-06-11 MED ORDER — SPIRONOLACTONE 25 MG PO TABS: 25.0000 mg | ORAL_TABLET | Freq: Once | ORAL | Status: DC

## 2015-06-11 MED ORDER — SACUBITRIL-VALSARTAN 24-26 MG PO TABS: 1.0000 | ORAL_TABLET | Freq: Two times a day (BID) | ORAL | Status: DC

## 2015-06-11 NOTE — Progress Notes (Signed)
[facility]. [address], [address] [place], [place]  [address] Phone: [phone] Fax:  [phone]  Date:  06/11/2015   ID:  Brittney Malone, DOB 1940/06/14, MRN [id number]  PCP:  [name], MD   History of Present Illness: Brittney Malone is a 75 y.o. female with dilated cardiomyopathy, nonischemic, EF 30% heart catheterization 04/02/13 here for followup. Possible viral etiology. Attempted to up titrate medications, could not tolerate higher dose of carvedilol or lisinopril. No dyspnea. No orthopnea. Low energy. 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 Lasix every other day.   She could not tolerate 25 mg of carvedilol twice a day. She felt fatigued as well as some chest discomfort with this she states. She's also had intermittent diarrhea. No significant shortness of breath currently. Her son has a cardiomyopathy as well.  She has had a robust response to Lasix. She is taking this every other day.  Prior recurrent bouts of pneumonia.   11/06/14 - arthritis in knees. Cortisone. Breath ok. No cp, syncope. Recent cold. Both of her sons have heart failure. They are seen in HF clinic  03/02/15-she is willing once again to try the [facility]. See below. Still feels fatigue, weakness. No syncope, mild shortness of breath with activity. Both of her sons are on this medication and feels so much better.   Wt Readings from Last 3 Encounters:  06/11/15 181 lb 12.8 oz (82.464 kg)  03/02/15 180 lb (81.647 kg)  11/06/14 173 lb (78.472 kg)     Past Medical History  Diagnosis Date  . Hypertension   . History of nuclear stress test     told results were WNL, - several yrs. ago    . Hypothyroidism   . Diabetes mellitus   . Neuromuscular disorder (HCC)     neuropathy- feet- bilateral   . Arthritis     knees- OA, uses topical treatment   . Restless leg syndrome   . Osteoarthritis     Past Surgical History  Procedure Laterality Date  . Abdominal hysterectomy    . Eye  surgery      cataracts (bilateral) - removed- /w IOL   . Tonsillectomy      as a youngster  . Appendectomy    . Maxillary antrostomy  02/02/2012    Procedure: MAXILLARY ANTROSTOMY;  Surgeon: Ruby Cola, MD;  Location: [facility] OR;  Service: ENT;  Laterality: Left;  Left Maxillary Antrostomy [id number]  . Sphenoidectomy  02/02/2012    Procedure: SPHENOIDECTOMY;  Surgeon: Ruby Cola, MD;  Location: [facility];  Service: ENT;  Laterality: Left;  Left Sphenoidectomy [phone]  . Nasal sinus surgery  02/02/2012    Procedure: ENDOSCOPIC SINUS SURGERY WITH STEALTH;  Surgeon: Ruby Cola, MD;  Location: [facility] OR;  Service: ENT;  Laterality: Left;  Left Frontal Sinusotomy [id number], and endoscopic resection of left anterior cranial fossa tumor [id number]  . Ethmoidectomy  02/02/2012    Procedure: ETHMOIDECTOMY;  Surgeon: Ruby Cola, MD;  Location: [facility];  Service: ENT;  Laterality: Left;  Left Ethmoidectomy [phone] with repair of CSF leak  . Carpal tunnel repair    . Knee cartilage surgery Left   . Cataract extraction, bilateral    . Other      Hemangiopericytoma, left nasal septum,repair of CSF leak  . Left heart catheterization with coronary angiogram N/A 04/02/2013    Procedure: LEFT HEART CATHETERIZATION WITH CORONARY ANGIOGRAM;  Surgeon: Candee Furbish, MD;  Location: [facility] CATH LAB;  Service:  Cardiovascular;  Laterality: N/A;    Current Outpatient Prescriptions  Medication Sig Dispense Refill  . acetaminophen (TYLENOL) 500 MG tablet Take 1,000 mg by mouth every 6 (six) hours as needed for pain.    . carvedilol (COREG) 12.5 MG tablet TAKE 1 TABLET (12.5 MG TOTAL) BY MOUTH 2 (TWO) TIMES DAILY WITH A MEAL. 60 tablet 4  . Cholecalciferol (VITAMIN D-3 PO) Take 1,000 Units by mouth daily.     Marland Kitchen glipiZIDE (GLUCOTROL) 10 MG tablet 1 TABLET TWICE A DAY FOR DIABETES ORALLY 90 DAYS  3  . KLOR-CON 10 10 MEQ tablet Take 10 mEq by mouth daily.   6  . levothyroxine (SYNTHROID, LEVOTHROID) 150 MCG tablet Take 150 mcg by mouth daily  before breakfast.    . metFORMIN (GLUCOPHAGE) 500 MG tablet Take 1,000 mg by mouth 2 (two) times daily with a meal.    . Misc Natural Products (OSTEO BI-FLEX JOINT SHIELD PO) Take by mouth.    . Omega-3 Fatty Acids (FISH OIL) 1200 MG CAPS Take 1,200 mg by mouth daily.     . Plant Sterols and Stanols (CHOLEST OFF) 450 MG TABS Take 450 mg by mouth 2 (two) times daily.     . sacubitril-valsartan (ENTRESTO) 24-26 MG Take 1 tablet by mouth 2 (two) times daily. 60 tablet 3  . SALINE NASAL SPRAY NA Place 1 spray into both nostrils daily as needed (allergies).    Marland Kitchen spironolactone (ALDACTONE) 25 MG tablet TAKE 1 TABLET (25 MG TOTAL) BY MOUTH DAILY. 90 tablet 3   No current facility-administered medications for this visit.    Allergies:    Allergies  Allergen Reactions  . Aspirin Other (See Comments)    GI distress  . Augmentin [Amoxicillin-Pot Clavulanate] Other (See Comments)    unknown  . Erythromycin Nausea And Vomiting  . Levaquin [Levofloxacin In D5w] Diarrhea    Social History:  The patient  reports that she has never smoked. She does not have any smokeless tobacco history on file. She reports that she does not drink alcohol or use illicit drugs.   ROS:  Please see the history of present illness.   Occasional diarrhea. No syncope, no dizziness, no orthopnea, no PND. Improved symptoms.    PHYSICAL EXAM: VS:  BP 112/68 mmHg  Pulse 88  Ht 5\' 2"  (1.575 m)  Wt 181 lb 12.8 oz (82.464 kg)  BMI 33.24 kg/m2  SpO2 99% Well nourished, well developed, in no acute distress HEENT: normal Neck: no JVD Cardiac:  normal S1, S2; RRR; 1/6 S murmur Lungs:  clear to auscultation bilaterally, no wheezing, rhonchi or rales Abd: soft, nontender, no hepatomegalyOverweight Ext: no edema Skin: warm and dry Neuro: no focal abnormalities noted  EKG:  05/12/14-sinus rhythm, 75, nonspecific ST-T wave changes, low voltage Cardiac catheterization reviewed as above. Reassuring.     LABS: 5/14: Creat 0.7, LDL  69, K 4.4 Echocardiogram: 06/21/13-EF 25%. ECHO 04/20/15 - 50-55%  ASSESSMENT AND PLAN:  1. Nonischemic cardiomyopathy/chronic systolic heart failure-previous EF 25% range currently 50-55% on medications. Excellent recovery. discussed medications. Carvedilol to 12.5 mg twice a day. Could not tolerate 25mg  BID.  On Aldactone. She is not currently taking any furosemide. Her weight is maintaining.  Both of her sons have heart failure and her taken care of by the advanced heart failure clinic. Entresto  If she begins to feel any dizziness or has hypotension, Aldactone will be the first to remove. checking lab work. It is likely that she does not  need her potassium 10 mEq supplement. She has been tolerating her spironolactone well with ACE inhibitor combination.  With her knee pain, try to avoid Aleve. giving her paper prescriptions. If in the next few months her EF remains normal and symptoms remain normal, we could consider backing off of the Entresto. For now, continue.  2. Obesity-encourage weight loss.   3. Tachycardia-improved. PAT.  carvedilol. Stopped digoxin. This was likely mediated by cardiomyopathy. 4. RTC in 4 months  Signed, Candee Furbish, MD [facility]  06/11/2015 9:12 AM

## 2015-06-11 NOTE — Patient Instructions (Signed)
Medication Instructions:  The current medical regimen is effective;  continue present plan and medications.  Labwork: Please have blood work today. (BMP)  Follow-Up: Follow up in 4 months with Dr. Marlou Porch.  You will receive a letter in the mail 2 months before you are due.  Please call us when you receive this letter to schedule your follow up appointment.   If you need a refill on your cardiac medications before your next appointment, please call your pharmacy.  Thank you for choosing [facility]!!

## 2015-06-29 ENCOUNTER — Other Ambulatory Visit: Payer: Self-pay

## 2015-06-29 DIAGNOSIS — Z1231 Encounter for screening mammogram for malignant neoplasm of breast: Secondary | ICD-10-CM

## 2015-07-07 ENCOUNTER — Telehealth: Payer: Self-pay | Admitting: Cardiology

## 2015-07-07 NOTE — Telephone Encounter (Signed)
New message     Pt c/o medication issue:  1. Name of Medication: entresto 2. How are you currently taking this medication (dosage and times per day)? 24-26mg   3. Are you having a reaction (difficulty breathing--STAT)? no 4. What is your medication issue?  Patient cannot afford the medications----can she stop it?

## 2015-07-07 NOTE — Telephone Encounter (Signed)
Spoke with pt who is reporting she had to change her pharmacy because of her insurance.  When she went to pick up the Entresto her co-pay was going to be $202/month.  Pt states she can not afford this.  Of note, she had noticed her BP has been lower from 112/68 to 96/65 though she has not had any dizziness etc.  She decided to take her medication 1 tablet in the AM and 1/2 in the PM.  She started doing this 3 nights ago and has not noticed a difference yet.  Advised her, I will forward this information to Dr Marlou Porch.  In his last note he did mention maybe backing off of her Entresto. I did call the pharmacy ([facility]) to make them aware she has approval for Entresto thru 11/2015.  They state they are aware of that and it must just be a change with the co-payment thru her insurance.

## 2015-07-08 NOTE — Telephone Encounter (Signed)
Since EF is now improved, lets stop Sandre Kitty, Elta Guadeloupe, MD

## 2015-07-08 NOTE — Telephone Encounter (Signed)
Reviewed information again with Dr Marlou Porch who states if Brittney Malone is going to cost the pt so much she can be changed back to Losartan.   Spoke with Benjamine Mola pharmacist from [facility] who stated that due to the patient's insurance carrier she is now required to pay for 50% of the cost of the medication as it is listed as a Tier 4.  Per patient, last year her Tier 4 medications cost $80/month and she was not required to pay 1/2 the cost.  I did speak with the Entresto rep this AM who asked to see if the pt will call [phone] (pt assistance program) to see if they can help further lower her cost.  She will try this and let me know if they are able to help her or not.  She will call back and let me know if she chooses to stay on Entresto or change to Losartan.  I advised the patient that as HER advocate I wanted her to be aware of all the assistance programs available to her as well as her options for other medications.  She states understanding.

## 2015-07-10 NOTE — Telephone Encounter (Signed)
Attempted to call patient back - line busy.  Will continue to attempt to contact her.

## 2015-07-13 NOTE — Telephone Encounter (Signed)
Called pt back to see if she was able to get any assistance for her Entresto.  Pt states she tried calling the # I gave her andkept getting transferred and sent to people she couldn't understand.  She became frustrated and decided to just pay for the medication for the next couple of months.  She states she will discuss it with Dr Marlou Porch when she come in for her appt.  appt scheduled for her.

## 2015-08-03 ENCOUNTER — Ambulatory Visit: Payer: Medicare Other

## 2015-08-24 ENCOUNTER — Ambulatory Visit
Admission: RE | Admit: 2015-08-24 | Discharge: 2015-08-24 | Disposition: A | Discharge status: Home / Self Care | Payer: Medicare Other | Source: Ambulatory Visit

## 2015-08-24 DIAGNOSIS — Z1231 Encounter for screening mammogram for malignant neoplasm of breast: Secondary | ICD-10-CM

## 2015-09-07 ENCOUNTER — Ambulatory Visit (INDEPENDENT_AMBULATORY_CARE_PROVIDER_SITE_OTHER): Payer: Medicare Other | Admitting: Cardiology

## 2015-09-07 ENCOUNTER — Encounter: Payer: Self-pay | Admitting: Cardiology

## 2015-09-07 VITALS — BP 118/62 | HR 80 | Ht 62.0 in | Wt 188.0 lb

## 2015-09-07 DIAGNOSIS — I420 Dilated cardiomyopathy: Secondary | ICD-10-CM

## 2015-09-07 DIAGNOSIS — I471 Supraventricular tachycardia: Secondary | ICD-10-CM

## 2015-09-07 DIAGNOSIS — I5022 Chronic systolic (congestive) heart failure: Secondary | ICD-10-CM | POA: Diagnosis not present

## 2015-09-07 DIAGNOSIS — Z79899 Other long term (current) drug therapy: Secondary | ICD-10-CM | POA: Diagnosis not present

## 2015-09-07 DIAGNOSIS — E669 Obesity, unspecified: Secondary | ICD-10-CM

## 2015-09-07 MED ORDER — VALSARTAN 40 MG PO TABS: 40.0000 mg | ORAL_TABLET | Freq: Every day | ORAL | Status: DC

## 2015-09-07 NOTE — Progress Notes (Signed)
[place]. [address], [address] [place], [place]  [address] Phone: [phone] Fax:  [phone]  Date:  09/07/2015   ID:  Brittney Malone, DOB [birth year], MRN [id number]  PCP:  [name], MD   History of Present Illness: Brittney Malone is a 75 y.o. female with dilated cardiomyopathy, nonischemic, EF 30% heart catheterization 04/02/13 here for followup. Possible viral etiology. Attempted to up titrate medications, could not tolerate higher dose of carvedilol or lisinopril. No dyspnea. No orthopnea. Low energy. 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 Lasix every other day.   She could not tolerate 25 mg of carvedilol twice a day. She felt fatigued as well as some chest discomfort with this she states. She's also had intermittent diarrhea. No significant shortness of breath currently. Her son has a cardiomyopathy as well.  She has had a robust response to Lasix. She is taking this every other day.  Prior recurrent bouts of pneumonia.   11/06/14 - arthritis in knees. Cortisone. Breath ok. No cp, syncope. Recent cold. Both of her sons have heart failure. They are seen in HF clinic  03/02/15-she is willing once again to try the [facility]. See below. Still feels fatigue, weakness. No syncope, mild shortness of breath with activity. Both of her sons are on this medication and feels so much better.  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 Some ankle swelling.   Entresto too expensive. No signal in shortness of breath, no syncope, no chest pain. His main complaint is fatigue. She has gained 7 pounds. She states that she has had many birthday parties. Trace ankle edema. Remember, prior ejection fraction normal.   Wt Readings from Last 3 Encounters:  09/07/15 188 lb (85.276 kg)  06/11/15 181 lb 12.8 oz (82.464 kg)  03/02/15 180 lb (81.647 kg)     Past Medical History  Diagnosis Date  . Hypertension   . History of nuclear stress test     told results were WNL, - several yrs.  ago    . Hypothyroidism   . Diabetes mellitus   . Neuromuscular disorder (HCC)     neuropathy- feet- bilateral   . Arthritis     knees- OA, uses topical treatment   . Restless leg syndrome   . Osteoarthritis     Past Surgical History  Procedure Laterality Date  . Abdominal hysterectomy    . Eye surgery      cataracts (bilateral) - removed- /w IOL   . Tonsillectomy      as a youngster  . Appendectomy    . Maxillary antrostomy  02/02/2012    Procedure: MAXILLARY ANTROSTOMY;  Surgeon: Ruby Cola, MD;  Location: [facility] OR;  Service: ENT;  Laterality: Left;  Left Maxillary Antrostomy [id number]  . Sphenoidectomy  02/02/2012    Procedure: SPHENOIDECTOMY;  Surgeon: Ruby Cola, MD;  Location: [place];  Service: ENT;  Laterality: Left;  Left Sphenoidectomy [phone]  . Nasal sinus surgery  02/02/2012    Procedure: ENDOSCOPIC SINUS SURGERY WITH STEALTH;  Surgeon: Ruby Cola, MD;  Location: [facility] OR;  Service: ENT;  Laterality: Left;  Left Frontal Sinusotomy [id number], and endoscopic resection of left anterior cranial fossa tumor [id number]  . Ethmoidectomy  02/02/2012    Procedure: ETHMOIDECTOMY;  Surgeon: Ruby Cola, MD;  Location: [place];  Service: ENT;  Laterality: Left;  Left Ethmoidectomy [id number] with repair of CSF leak  . Carpal tunnel repair    . Knee cartilage surgery Left   .  Cataract extraction, bilateral    . Other      Hemangiopericytoma, left nasal septum,repair of CSF leak  . Left heart catheterization with coronary angiogram N/A 04/02/2013    Procedure: LEFT HEART CATHETERIZATION WITH CORONARY ANGIOGRAM;  Surgeon: Candee Furbish, MD;  Location: [facility] CATH LAB;  Service: Cardiovascular;  Laterality: N/A;    Current Outpatient Prescriptions  Medication Sig Dispense Refill  . acetaminophen (TYLENOL) 500 MG tablet Take 1,000 mg by mouth every 6 (six) hours as needed for pain.    . carvedilol (COREG) 12.5 MG tablet Take 1 tablet (12.5 mg total) by mouth 2 (two) times daily with a meal. 60 tablet 11  .  glipiZIDE (GLUCOTROL) 10 MG tablet 1 TABLET TWICE A DAY FOR DIABETES ORALLY 90 DAYS  3  . levothyroxine (SYNTHROID, LEVOTHROID) 125 MCG tablet Take 125 mcg by mouth daily before breakfast.   2  . metFORMIN (GLUCOPHAGE) 500 MG tablet Take 1,000 mg by mouth 2 (two) times daily with a meal.    . Misc Natural Products (OSTEO BI-FLEX JOINT SHIELD PO) Take 1 tablet by mouth daily.     . Omega-3 Fatty Acids (FISH OIL) 1200 MG CAPS Take 1,200 mg by mouth daily.     . Plant Sterols and Stanols (CHOLEST OFF) 450 MG TABS Take 450 mg by mouth 2 (two) times daily.     . sacubitril-valsartan (ENTRESTO) 24-26 MG Take 1 tablet by mouth 2 (two) times daily. 60 tablet 11  . SALINE NASAL SPRAY NA Place 1 spray into both nostrils daily as needed (allergies).    Marland Kitchen spironolactone (ALDACTONE) 25 MG tablet Take 1 tablet (25 mg total) by mouth once. 90 tablet 3   No current facility-administered medications for this visit.    Allergies:    Allergies  Allergen Reactions  . Aspirin Other (See Comments)    GI distress  . Augmentin [Amoxicillin-Pot Clavulanate] Other (See Comments)    unknown  . Erythromycin Nausea And Vomiting  . Levaquin [Levofloxacin In D5w] Diarrhea    Social History:  The patient  reports that she has never smoked. She does not have any smokeless tobacco history on file. She reports that she does not drink alcohol or use illicit drugs.   ROS:  Please see the history of present illness.   Occasional diarrhea. No syncope, no dizziness, no orthopnea, no PND. Improved symptoms.    PHYSICAL EXAM: VS:  BP 118/62 mmHg  Pulse 80  Ht 5\' 2"  (1.575 m)  Wt 188 lb (85.276 kg)  BMI 34.38 kg/m2 Well nourished, well developed, in no acute distress HEENT: normal Neck: no JVD Cardiac:  normal S1, S2; RRR; 1/6 S murmur Lungs:  clear to auscultation bilaterally, no wheezing, rhonchi or rales Abd: soft, nontender, no hepatomegalyOverweight Ext: no edema sometimes trace ankle edema Skin: warm and  dry Neuro: no focal abnormalities noted  EKG:  05/12/14-sinus rhythm, 75, nonspecific ST-T wave changes, low voltage Cardiac catheterization reviewed as above. Reassuring.     LABS: 5/14: Creat 0.7, LDL 69, K 4.4 Echocardiogram: 06/21/13-EF 25%. ECHO 04/20/15 - 50-55%  ASSESSMENT AND PLAN:  1. Nonischemic cardiomyopathy/chronic systolic heart failure-previous EF 25% range currently 50-55% on medications. Excellent recovery. discussed medications. Carvedilol to 12.5 mg twice a day. Could not tolerate 25mg  BID.  On Aldactone. She is not currently taking any furosemide.  Both of her sons have heart failure and her taken care of by the advanced heart failure clinic. Entresto.  We had to discontinue contrast because  of cost again. 2. Obesity-encourage weight loss.   Since we are stopping in chest, we will go ahead and start valsartan 40 mg once a day. I will check a basic metabolic profile in 1-2 weeks. 3.  fatigue-likely multifactorial. May be partial side effect of carvedilol as well. EF normal. 4. Tachycardia-improved. PAT.  carvedilol. Stopped digoxin. This was likely mediated by cardiomyopathy. 5. RTC in 6 months  Signed, Candee Furbish, MD [facility]  09/07/2015 10:05 AM

## 2015-09-07 NOTE — Patient Instructions (Signed)
Medication Instructions:  Please stop your Entresto and start Valsartan 40 mg a day. Continue all other medications as listed.  Labwork: Please have blood work in 1 to 2 weeks (BMP)  Follow-Up: Follow up in 6 months with Dr. Marlou Porch.  You will receive a letter in the mail 2 months before you are due.  Please call us when you receive this letter to schedule your follow up appointment.  If you need a refill on your cardiac medications before your next appointment, please call your pharmacy.  Thank you for choosing [facility] [place]!!

## 2015-09-28 ENCOUNTER — Other Ambulatory Visit: Payer: Medicare Other

## 2015-10-08 ENCOUNTER — Other Ambulatory Visit (INDEPENDENT_AMBULATORY_CARE_PROVIDER_SITE_OTHER): Payer: Medicare Other | Admitting: *Deleted

## 2015-10-08 DIAGNOSIS — I420 Dilated cardiomyopathy: Secondary | ICD-10-CM

## 2015-10-08 DIAGNOSIS — I5022 Chronic systolic (congestive) heart failure: Secondary | ICD-10-CM | POA: Diagnosis not present

## 2015-10-08 DIAGNOSIS — Z79899 Other long term (current) drug therapy: Secondary | ICD-10-CM

## 2015-10-08 LAB — BASIC METABOLIC PANEL
BUN: 13 mg/dL (ref 7–25)
CO2: 25 mmol/L (ref 20–31)
Calcium: 8.6 mg/dL (ref 8.6–10.4)
Chloride: 103 mmol/L (ref 98–110)
Creat: 0.69 mg/dL (ref 0.60–0.93)
Glucose, Bld: 211 mg/dL — ABNORMAL HIGH (ref 65–99)
Potassium: 4.3 mmol/L (ref 3.5–5.3)
Sodium: 138 mmol/L (ref 135–146)

## 2015-10-08 NOTE — Addendum Note (Signed)
Addended by: Eulis Foster on: 10/08/2015 09:59 AM   Modules accepted: Orders

## 2016-04-11 ENCOUNTER — Encounter: Payer: Self-pay | Admitting: Cardiology

## 2016-04-11 ENCOUNTER — Ambulatory Visit (INDEPENDENT_AMBULATORY_CARE_PROVIDER_SITE_OTHER): Payer: Medicare Other | Admitting: Cardiology

## 2016-04-11 VITALS — BP 126/72 | HR 84 | Ht 62.0 in | Wt 197.4 lb

## 2016-04-11 DIAGNOSIS — E6609 Other obesity due to excess calories: Secondary | ICD-10-CM | POA: Diagnosis not present

## 2016-04-11 DIAGNOSIS — E7800 Pure hypercholesterolemia, unspecified: Secondary | ICD-10-CM | POA: Diagnosis not present

## 2016-04-11 DIAGNOSIS — I428 Other cardiomyopathies: Secondary | ICD-10-CM

## 2016-04-11 NOTE — Patient Instructions (Signed)
Medication Instructions:  Your physician recommends that you continue on your current medications as directed. Please refer to the Current Medication list given to you today.   Labwork: None  Testing/Procedures: None   Follow-Up: Your physician wants you to follow-up in: 6 months with Dr Marlou Porch. (May 2018).  You will receive a reminder letter in the mail two months in advance. If you don't receive a letter, please call our office to schedule the follow-up appointment.   Any Other Special Instructions Will Be Listed Below (If Applicable).   You had a flu shot today.  If you need a refill on your cardiac medications before your next appointment, please call your pharmacy.

## 2016-04-11 NOTE — Progress Notes (Signed)
[place]. [address], [address] [place], [place]  [address] Phone: [phone] Fax:  [phone]  Date:  04/11/2016   ID:  Brittney Malone, DOB 1940-11-23, MRN [id number]  PCP:  [name], MD   History of Present Illness: Brittney Malone is a 75 y.o. female with dilated cardiomyopathy, nonischemic, EF 30% heart catheterization 04/02/13 here for followup. Possible viral etiology. Attempted to up titrate medications, could not tolerate higher dose of carvedilol or lisinopril. No dyspnea. No orthopnea. Low energy. 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 Lasix every other day.   She could not tolerate 25 mg of carvedilol twice a day. She felt fatigued as well as some chest discomfort with this she states. She's also had intermittent diarrhea. No significant shortness of breath currently. Her son has a cardiomyopathy as well.  She has had a robust response to Lasix. She is taking this every other day.  Prior recurrent bouts of pneumonia.   11/06/14 - arthritis in knees. Cortisone. Breath ok. No cp, syncope. Recent cold. Both of her sons have heart failure. They are seen in HF clinic  03/02/15-she is willing once again to try the [facility]. See below. Still feels fatigue, weakness. No syncope, mild shortness of breath with activity. Both of her sons are on this medication and feels so much better.  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 Some ankle swelling.   Entresto too expensive. No signal in shortness of breath, no syncope, no chest pain. His main complaint is fatigue. She has gained 7 pounds. She states that she has had many birthday parties. Trace ankle edema. Remember, prior ejection fraction normal.   Wt Readings from Last 3 Encounters:  04/11/16 197 lb 6.4 oz (89.5 kg)  09/07/15 188 lb (85.3 kg)  06/11/15 181 lb 12.8 oz (82.5 kg)     Past Medical History:  Diagnosis Date  . Arthritis    knees- OA, uses topical treatment   . Diabetes mellitus   . History of  nuclear stress test    told results were WNL, - several yrs. ago    . Hypertension   . Hypothyroidism   . Neuromuscular disorder (HCC)    neuropathy- feet- bilateral   . Osteoarthritis   . Restless leg syndrome     Past Surgical History:  Procedure Laterality Date  . ABDOMINAL HYSTERECTOMY    . APPENDECTOMY    . Carpal Tunnel Repair    . CATARACT EXTRACTION, BILATERAL    . ETHMOIDECTOMY  02/02/2012   Procedure: ETHMOIDECTOMY;  Surgeon: Ruby Cola, MD;  Location: [place];  Service: ENT;  Laterality: Left;  Left Ethmoidectomy [phone] with repair of CSF leak  . EYE SURGERY     cataracts (bilateral) - removed- /w IOL   . KNEE CARTILAGE SURGERY Left   . LEFT HEART CATHETERIZATION WITH CORONARY ANGIOGRAM N/A 04/02/2013   Procedure: LEFT HEART CATHETERIZATION WITH CORONARY ANGIOGRAM;  Surgeon: Candee Furbish, MD;  Location: [facility] CATH LAB;  Service: Cardiovascular;  Laterality: N/A;  . MAXILLARY ANTROSTOMY  02/02/2012   Procedure: MAXILLARY ANTROSTOMY;  Surgeon: Ruby Cola, MD;  Location: [place];  Service: ENT;  Laterality: Left;  Left Maxillary Antrostomy [id number]  . NASAL SINUS SURGERY  02/02/2012   Procedure: ENDOSCOPIC SINUS SURGERY WITH STEALTH;  Surgeon: Ruby Cola, MD;  Location: [facility] OR;  Service: ENT;  Laterality: Left;  Left Frontal Sinusotomy [id number], and endoscopic resection of left anterior cranial fossa tumor [id number]  . other  Hemangiopericytoma, left nasal septum,repair of CSF leak  . SPHENOIDECTOMY  02/02/2012   Procedure: SPHENOIDECTOMY;  Surgeon: Ruby Cola, MD;  Location: [place];  Service: ENT;  Laterality: Left;  Left Sphenoidectomy [id number]  . TONSILLECTOMY     as a youngster    Current Outpatient Prescriptions  Medication Sig Dispense Refill  . acetaminophen (TYLENOL) 500 MG tablet Take 1,000 mg by mouth every 6 (six) hours as needed for pain.    . carvedilol (COREG) 12.5 MG tablet Take 1 tablet (12.5 mg total) by mouth 2 (two) times daily with a meal. 60 tablet 11  .  glipiZIDE (GLUCOTROL) 10 MG tablet 1 TABLET TWICE A DAY FOR DIABETES ORALLY 90 DAYS  3  . levothyroxine (SYNTHROID, LEVOTHROID) 125 MCG tablet Take 125 mcg by mouth daily before breakfast.   2  . metFORMIN (GLUCOPHAGE) 500 MG tablet Patient takes 1 tablet by mouth in the morning and two tablets by mouth at night.    . Misc Natural Products (OSTEO BI-FLEX JOINT SHIELD PO) Take 1 tablet by mouth daily.     . Omega-3 Fatty Acids (FISH OIL) 1200 MG CAPS Take 1,200 mg by mouth daily.     . Plant Sterols and Stanols (CHOLEST OFF) 450 MG TABS Take 450 mg by mouth 2 (two) times daily.     Marland Kitchen SALINE NASAL SPRAY NA Place 1 spray into both nostrils daily as needed (allergies).    Marland Kitchen spironolactone (ALDACTONE) 25 MG tablet Take 1 tablet (25 mg total) by mouth once. 90 tablet 3  . valsartan (DIOVAN) 40 MG tablet Take 1 tablet (40 mg total) by mouth daily. 30 tablet 11   No current facility-administered medications for this visit.     Allergies:    Allergies  Allergen Reactions  . Aspirin Other (See Comments)    GI distress  . Augmentin [Amoxicillin-Pot Clavulanate] Other (See Comments)    unknown  . Erythromycin Nausea And Vomiting  . Levaquin [Levofloxacin In D5w] Diarrhea    Social History:  The patient  reports that she has never smoked. She does not have any smokeless tobacco history on file. She reports that she does not drink alcohol or use drugs.   ROS:  Please see the history of present illness.   Occasional diarrhea. No syncope, no dizziness, no orthopnea, no PND. Improved symptoms.    PHYSICAL EXAM: VS:  BP 126/72   Pulse 84   Ht 5\' 2"  (1.575 m)   Wt 197 lb 6.4 oz (89.5 kg)   LMP  (LMP Unknown)   BMI 36.10 kg/m  Well nourished, well developed, in no acute distress  HEENT: normal  Neck: no JVD  Cardiac:  normal S1, S2; RRR; 1/6 S murmur  Lungs:  clear to auscultation bilaterally, no wheezing, rhonchi or rales  Abd: soft, nontender, no hepatomegaly Overweight Ext: no edema   sometimes trace ankle edema Skin: warm and dry  Neuro: no focal abnormalities noted  EKG:  EKG ordered on 04/11/16-sinus rhythm 85 left axis deviation, borderline low voltage, septal infarct pattern personally viewed-prior 05/12/14-sinus rhythm, 75, nonspecific ST-T wave changes, low voltage Cardiac catheterization reviewed as above. Reassuring.     LABS: 5/14: Creat 0.7, LDL 69, K 4.4 Echocardiogram: 06/21/13-EF 25%. ECHO 04/20/15 - 50-55%  ASSESSMENT AND PLAN:  1. Nonischemic cardiomyopathy/chronic systolic heart failure-resolved, previous EF 25% range currently 50-55% on medications. Excellent recovery. discussed medications. Carvedilol to 12.5 mg twice a day. Could not tolerate 25mg  BID.  On Aldactone. She is  not currently taking any furosemide.  Both of her sons have heart failure and her taken care of by the advanced heart failure clinic. Entresto.  We had to discontinue contrast because of cost again. With her return to normal ejection fraction, there is not a clear indication for resuming this medication. 2. Obesity-encourage weight loss.   valsartan 40 mg once a day. 3.  fatigue-likely multifactorial. May be partial side effect of carvedilol as well. EF normal. I explained that her obesity is certainly playing a major role. Carrying around a 50 pound backpack can be challenging. Encouraged them to get a stationary bicycle. She has left knee osteoarthritis. Also try to avoid [employer]. 4. Tachycardia-improved. PAT.  carvedilol. Stopped digoxin. PAT was likely mediated by cardiomyopathy. 5. Flu shot 6. RTC in 6 months  Signed, Candee Furbish, MD [facility]  04/11/2016 9:33 AM

## 2016-06-01 ENCOUNTER — Other Ambulatory Visit: Payer: Self-pay | Admitting: Cardiology

## 2016-07-01 ENCOUNTER — Other Ambulatory Visit: Payer: Self-pay | Admitting: Cardiology

## 2016-07-04 DIAGNOSIS — E1142 Type 2 diabetes mellitus with diabetic polyneuropathy: Secondary | ICD-10-CM | POA: Diagnosis not present

## 2016-07-04 DIAGNOSIS — Z0000 Encounter for general adult medical examination without abnormal findings: Secondary | ICD-10-CM | POA: Diagnosis not present

## 2016-07-04 DIAGNOSIS — E039 Hypothyroidism, unspecified: Secondary | ICD-10-CM | POA: Diagnosis not present

## 2016-07-04 DIAGNOSIS — I10 Essential (primary) hypertension: Secondary | ICD-10-CM | POA: Diagnosis not present

## 2016-07-04 DIAGNOSIS — M179 Osteoarthritis of knee, unspecified: Secondary | ICD-10-CM | POA: Diagnosis not present

## 2016-07-04 DIAGNOSIS — Z7984 Long term (current) use of oral hypoglycemic drugs: Secondary | ICD-10-CM | POA: Diagnosis not present

## 2016-07-04 DIAGNOSIS — E7800 Pure hypercholesterolemia, unspecified: Secondary | ICD-10-CM | POA: Diagnosis not present

## 2016-07-04 DIAGNOSIS — I429 Cardiomyopathy, unspecified: Secondary | ICD-10-CM | POA: Diagnosis not present

## 2016-07-07 DIAGNOSIS — Z1211 Encounter for screening for malignant neoplasm of colon: Secondary | ICD-10-CM | POA: Diagnosis not present

## 2016-08-01 ENCOUNTER — Other Ambulatory Visit: Payer: Self-pay | Admitting: Family Medicine

## 2016-08-01 DIAGNOSIS — Z1231 Encounter for screening mammogram for malignant neoplasm of breast: Secondary | ICD-10-CM

## 2016-08-29 ENCOUNTER — Other Ambulatory Visit: Payer: Self-pay | Admitting: Cardiology

## 2016-08-29 MED ORDER — VALSARTAN 40 MG PO TABS: 40.0000 mg | ORAL_TABLET | Freq: Every day | ORAL | 7 refills | Status: DC

## 2016-09-06 ENCOUNTER — Other Ambulatory Visit: Payer: Self-pay | Admitting: Cardiology

## 2016-09-12 ENCOUNTER — Ambulatory Visit
Admission: RE | Admit: 2016-09-12 | Discharge: 2016-09-12 | Disposition: A | Discharge status: Home / Self Care | Payer: Medicare HMO | Source: Ambulatory Visit | Attending: Family Medicine | Admitting: Family Medicine

## 2016-09-12 DIAGNOSIS — Z1231 Encounter for screening mammogram for malignant neoplasm of breast: Secondary | ICD-10-CM

## 2016-10-17 ENCOUNTER — Ambulatory Visit (INDEPENDENT_AMBULATORY_CARE_PROVIDER_SITE_OTHER): Payer: Medicare HMO | Admitting: Cardiology

## 2016-10-17 VITALS — BP 136/78 | HR 93 | Ht 62.0 in | Wt 198.0 lb

## 2016-10-17 DIAGNOSIS — I428 Other cardiomyopathies: Secondary | ICD-10-CM | POA: Diagnosis not present

## 2016-10-17 DIAGNOSIS — I5022 Chronic systolic (congestive) heart failure: Secondary | ICD-10-CM

## 2016-10-17 DIAGNOSIS — I471 Supraventricular tachycardia: Secondary | ICD-10-CM | POA: Diagnosis not present

## 2016-10-17 DIAGNOSIS — E7800 Pure hypercholesterolemia, unspecified: Secondary | ICD-10-CM | POA: Diagnosis not present

## 2016-10-17 NOTE — Patient Instructions (Addendum)
Medication Instructions:  Continue current medications as listed.  Follow-Up: Follow up in 6 months with Dr. Marlou Porch.  You will receive a letter in the mail 2 months before you are due.  Please call us when you receive this letter to schedule your follow up appointment.  If you need a refill on your cardiac medications before your next appointment, please call your pharmacy.  Thank you for choosing [place]!!

## 2016-10-17 NOTE — Progress Notes (Signed)
[place]. [address], [address] [place], [place]  [address] Phone: [phone] Fax:  [phone]  Date:  10/17/2016   ID:  Brittney Malone, DOB [birth year], MRN [id number]  PCP:  [name], MD   History of Present Illness: Brittney Malone is a 76 y.o. female with dilated cardiomyopathy, nonischemic, EF 30% heart catheterization 04/02/13 here for followup. Possible viral etiology.  Previously attempted to up titrate medications, could not tolerate higher dose of carvedilol or lisinopril. No dyspnea. No orthopnea.   Low energy. 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 Lasix every other day.   She could not tolerate 25 mg of carvedilol twice a day. She felt fatigued as well as some chest discomfort with this she states. She's also had intermittent diarrhea. No significant shortness of breath currently. Her son has a cardiomyopathy as well.  She has had a robust response to Lasix. She is taking this every other day.  Prior recurrent bouts of pneumonia.   11/06/14 - arthritis in knees. Cortisone. Breath ok. No cp, syncope. Recent cold. Both of her sons have heart failure. They are seen in HF clinic  03/02/15-she is willing once again to try the [facility]. See below. Still feels fatigue, weakness. No syncope, mild shortness of breath with activity. Both of her sons are on this medication and feels so much better.  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 Some ankle swelling.   Entresto too expensive. No signal in shortness of breath, no syncope, no chest pain. His main complaint is fatigue. She has gained 7 pounds. She states that she has had many birthday parties. Trace ankle edema. Remember, prior ejection fraction normal.  10/17/16 no significant changes from prior visit. No chest pain, no significant shortness of breath. Still having fatigue. Weight has increased.   Wt Readings from Last 3 Encounters:  10/17/16 198 lb (89.8 kg)  04/11/16 197 lb 6.4 oz (89.5 kg)  09/07/15  188 lb (85.3 kg)     Past Medical History:  Diagnosis Date  . Arthritis    knees- OA, uses topical treatment   . Diabetes mellitus   . History of nuclear stress test    told results were WNL, - several yrs. ago    . Hypertension   . Hypothyroidism   . Neuromuscular disorder (HCC)    neuropathy- feet- bilateral   . Osteoarthritis   . Restless leg syndrome     Past Surgical History:  Procedure Laterality Date  . ABDOMINAL HYSTERECTOMY    . APPENDECTOMY    . Carpal Tunnel Repair    . CATARACT EXTRACTION, BILATERAL    . ETHMOIDECTOMY  02/02/2012   Procedure: ETHMOIDECTOMY;  Surgeon: Ruby Cola, MD;  Location: [place];  Service: ENT;  Laterality: Left;  Left Ethmoidectomy [phone] with repair of CSF leak  . EYE SURGERY     cataracts (bilateral) - removed- /w IOL   . KNEE CARTILAGE SURGERY Left   . LEFT HEART CATHETERIZATION WITH CORONARY ANGIOGRAM N/A 04/02/2013   Procedure: LEFT HEART CATHETERIZATION WITH CORONARY ANGIOGRAM;  Surgeon: Candee Furbish, MD;  Location: [facility] CATH LAB;  Service: Cardiovascular;  Laterality: N/A;  . MAXILLARY ANTROSTOMY  02/02/2012   Procedure: MAXILLARY ANTROSTOMY;  Surgeon: Ruby Cola, MD;  Location: [place];  Service: ENT;  Laterality: Left;  Left Maxillary Antrostomy [id number]  . NASAL SINUS SURGERY  02/02/2012   Procedure: ENDOSCOPIC SINUS SURGERY WITH STEALTH;  Surgeon: Ruby Cola, MD;  Location: [place];  Service:  ENT;  Laterality: Left;  Left Frontal Sinusotomy [id number], and endoscopic resection of left anterior cranial fossa tumor [id number]  . other     Hemangiopericytoma, left nasal septum,repair of CSF leak  . SPHENOIDECTOMY  02/02/2012   Procedure: SPHENOIDECTOMY;  Surgeon: Ruby Cola, MD;  Location: [place];  Service: ENT;  Laterality: Left;  Left Sphenoidectomy [phone]  . TONSILLECTOMY     as a youngster    Current Outpatient Prescriptions  Medication Sig Dispense Refill  . acetaminophen (TYLENOL) 500 MG tablet Take 1,000 mg by mouth every 6 (six) hours  as needed for pain.    . carvedilol (COREG) 12.5 MG tablet TAKE 1 TABLET (12.5 MG TOTAL) BY MOUTH 2 (TWO) TIMES DAILY WITH A MEAL. 60 tablet 8  . glipiZIDE (GLUCOTROL) 10 MG tablet 1 TABLET TWICE A DAY FOR DIABETES ORALLY 90 DAYS  3  . levothyroxine (SYNTHROID, LEVOTHROID) 125 MCG tablet Take 125 mcg by mouth daily before breakfast.   2  . metFORMIN (GLUCOPHAGE) 500 MG tablet Patient takes 1 tablet by mouth in the morning and two tablets by mouth at night.    . Misc Natural Products (OSTEO BI-FLEX JOINT SHIELD PO) Take 1 tablet by mouth daily.     . Omega-3 Fatty Acids (FISH OIL) 1200 MG CAPS Take 1,200 mg by mouth daily.     . Plant Sterols and Stanols (CHOLEST OFF) 450 MG TABS Take 450 mg by mouth 2 (two) times daily.     Marland Kitchen SALINE NASAL SPRAY NA Place 1 spray into both nostrils daily as needed (allergies).    Marland Kitchen spironolactone (ALDACTONE) 25 MG tablet TAKE 1 TABLET BY MOUTH ONCE DAILY 90 tablet 3  . valsartan (DIOVAN) 40 MG tablet Take 1 tablet (40 mg total) by mouth daily. 30 tablet 7   No current facility-administered medications for this visit.     Allergies:    Allergies  Allergen Reactions  . Aspirin Other (See Comments)    GI distress  . Augmentin [Amoxicillin-Pot Clavulanate] Other (See Comments)    unknown  . Erythromycin Nausea And Vomiting  . Levaquin [Levofloxacin In D5w] Diarrhea    Social History:  The patient  reports that she has never smoked. She does not have any smokeless tobacco history on file. She reports that she does not drink alcohol or use drugs.   ROS:  Please see the history of present illness.   Occasional diarrhea. No syncope, no dizziness, no orthopnea, no PND. Improved symptoms.    PHYSICAL EXAM: VS:  BP 136/78   Pulse 93   Ht 5\' 2"  (1.575 m)   Wt 198 lb (89.8 kg)   LMP  (LMP Unknown)   SpO2 96%   BMI 36.21 kg/m  Well nourished, well developed, in no acute distress  HEENT: normal  Neck: no JVD  Cardiac:  normal S1, S2; RRR; 2/6 S murmur    Lungs:  clear to auscultation bilaterally, no wheezing, rhonchi or rales  Abd: soft, nontender, no hepatomegaly Overweight Ext: no edema  sometimes trace ankle edema Skin: warm and dry  Neuro: no focal abnormalities noted  EKG:  EKG ordered on 04/11/16-sinus rhythm 85 left axis deviation, borderline low voltage, septal infarct pattern personally viewed-prior 05/12/14-sinus rhythm, 75, nonspecific ST-T wave changes, low voltage Cardiac catheterization reviewed as above. Reassuring.     LABS: 5/14: Creat 0.7, LDL 69, K 4.4 Echocardiogram: 06/21/13-EF 25%. ECHO 04/20/15 - 50-55%  ASSESSMENT AND PLAN:   Resolved nonischemic cardiomyopathy  - EF originally  was in the 25% range but at last check in 2016 November was low normal. Excellent.  - We will continue with carvedilol and valsartan. Spironolactone as well.  - Low energy has been a common complaint since the beginning, continue to encourage exercise, weight loss. Her knee osteoarthritis is causing issues.  - She once again asked me about Entresto -does not need it if her EF is now greater than 40. Cost was previously an issue  Low energy/obesity  - Continue to work on fatigue.  - Would hate to decrease her carvedilol. Blood pressure excellent. Cardiomyopathy resolved.  Paroxysmal atrial tachycardia  - Improved with carvedilol. Stopped previously used digoxin.  Six-month follow-up  Signed, Candee Furbish, MD [facility]  10/17/2016 10:16 AM

## 2016-11-07 DIAGNOSIS — L308 Other specified dermatitis: Secondary | ICD-10-CM | POA: Diagnosis not present

## 2016-11-07 DIAGNOSIS — L570 Actinic keratosis: Secondary | ICD-10-CM | POA: Diagnosis not present

## 2016-11-07 DIAGNOSIS — L821 Other seborrheic keratosis: Secondary | ICD-10-CM | POA: Diagnosis not present

## 2016-12-19 DIAGNOSIS — E119 Type 2 diabetes mellitus without complications: Secondary | ICD-10-CM | POA: Diagnosis not present

## 2017-01-20 ENCOUNTER — Telehealth: Payer: Self-pay | Admitting: *Deleted

## 2017-01-20 MED ORDER — LOSARTAN POTASSIUM 25 MG PO TABS: 25.0000 mg | ORAL_TABLET | Freq: Every day | ORAL | 3 refills | Status: DC

## 2017-01-20 NOTE — Telephone Encounter (Signed)
Patient called in requesting another medication to replace the valsartan. She would like the rx to be sent to [facility] on [address]. Patient can be reached at [phone]. Thanks, MI

## 2017-01-20 NOTE — Telephone Encounter (Signed)
Spoke to pt regarding valsartan recall. Switching from valsartan 40 mg daily to losartan 25 mg daily for systolic CHF. Pt agreed, will call the clinic with any problems or concerns. Sent new losartan rx to preferred pharmacy.

## 2017-01-27 DIAGNOSIS — J069 Acute upper respiratory infection, unspecified: Secondary | ICD-10-CM | POA: Diagnosis not present

## 2017-01-29 ENCOUNTER — Other Ambulatory Visit: Payer: Self-pay | Admitting: Cardiology

## 2017-03-06 DIAGNOSIS — E1142 Type 2 diabetes mellitus with diabetic polyneuropathy: Secondary | ICD-10-CM | POA: Diagnosis not present

## 2017-03-06 DIAGNOSIS — E039 Hypothyroidism, unspecified: Secondary | ICD-10-CM | POA: Diagnosis not present

## 2017-03-06 DIAGNOSIS — I429 Cardiomyopathy, unspecified: Secondary | ICD-10-CM | POA: Diagnosis not present

## 2017-03-06 DIAGNOSIS — M179 Osteoarthritis of knee, unspecified: Secondary | ICD-10-CM | POA: Diagnosis not present

## 2017-03-06 DIAGNOSIS — E7800 Pure hypercholesterolemia, unspecified: Secondary | ICD-10-CM | POA: Diagnosis not present

## 2017-03-06 DIAGNOSIS — Z23 Encounter for immunization: Secondary | ICD-10-CM | POA: Diagnosis not present

## 2017-03-06 DIAGNOSIS — I10 Essential (primary) hypertension: Secondary | ICD-10-CM | POA: Diagnosis not present

## 2017-05-22 ENCOUNTER — Ambulatory Visit: Payer: Medicare HMO | Admitting: Cardiology

## 2017-07-10 ENCOUNTER — Encounter: Payer: Self-pay | Admitting: Cardiology

## 2017-07-10 ENCOUNTER — Ambulatory Visit: Payer: Medicare HMO | Admitting: Cardiology

## 2017-07-10 VITALS — BP 106/76 | HR 90 | Ht 62.0 in | Wt 201.2 lb

## 2017-07-10 DIAGNOSIS — E7800 Pure hypercholesterolemia, unspecified: Secondary | ICD-10-CM

## 2017-07-10 DIAGNOSIS — I471 Supraventricular tachycardia: Secondary | ICD-10-CM

## 2017-07-10 NOTE — Progress Notes (Signed)
[facility]. [address] [place], [place]  [address] Phone: [phone] Fax:  [phone]  Date:  07/10/2017   ID:  Brittney Malone, DOB 01/19/1941, MRN [id number]  PCP:  [name], MD   History of Present Illness: Brittney Malone is a 77 y.o. female with dilated cardiomyopathy, nonischemic, EF 30% heart catheterization 04/02/13 here for followup. Possible viral etiology.  Previously attempted to up titrate medications, could not tolerate higher dose of carvedilol or lisinopril. No dyspnea. No orthopnea.   Low energy. 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 Lasix every other day.   She could not tolerate 25 mg of carvedilol twice a day. She felt fatigued as well as some chest discomfort with this she states. She's also had intermittent diarrhea. No significant shortness of breath currently. Her son has a cardiomyopathy as well.  She has had a robust response to Lasix. She is taking this every other day.  Prior recurrent bouts of pneumonia.   11/06/14 - arthritis in knees. Cortisone. Breath ok. No cp, syncope. Recent cold. Both of her sons have heart failure. They are seen in HF clinic  03/02/15-she is willing once again to try the [facility]. See below. Still feels fatigue, weakness. No syncope, mild shortness of breath with activity. Both of her sons are on this medication and feels so much better.  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 Some ankle swelling.   Entresto too expensive. No signal in shortness of breath, no syncope, no chest pain. His main complaint is fatigue. She has gained 7 pounds. She states that she has had many birthday parties. Trace ankle edema. Remember, prior ejection fraction normal.  10/17/16 no significant changes from prior visit. No chest pain, no significant shortness of breath. Still having fatigue. Weight has increased.  07/10/17-overall has been stable, no significant changes.  Still taking care of grandchildren.  23-year-old at home.   Can feel shortness of breath with some activity, fatigue.  Knee arthritis main complaint.  No orthopnea PND syncope bleeding.  Wt Readings from Last 3 Encounters:  07/10/17 201 lb 4 oz (91.3 kg)  10/17/16 198 lb (89.8 kg)  04/11/16 197 lb 6.4 oz (89.5 kg)     Past Medical History:  Diagnosis Date  . Arthritis    knees- OA, uses topical treatment   . Diabetes mellitus   . History of nuclear stress test    told results were WNL, - several yrs. ago    . Hypertension   . Hypothyroidism   . Neuromuscular disorder (HCC)    neuropathy- feet- bilateral   . Osteoarthritis   . Restless leg syndrome     Past Surgical History:  Procedure Laterality Date  . ABDOMINAL HYSTERECTOMY    . APPENDECTOMY    . Carpal Tunnel Repair    . CATARACT EXTRACTION, BILATERAL    . ETHMOIDECTOMY  02/02/2012   Procedure: ETHMOIDECTOMY;  Surgeon: Ruby Cola, MD;  Location: [place] [facility];  Service: ENT;  Laterality: Left;  Left Ethmoidectomy [phone] with repair of CSF leak  . EYE SURGERY     cataracts (bilateral) - removed- /w IOL   . KNEE CARTILAGE SURGERY Left   . LEFT HEART CATHETERIZATION WITH CORONARY ANGIOGRAM N/A 04/02/2013   Procedure: LEFT HEART CATHETERIZATION WITH CORONARY ANGIOGRAM;  Surgeon: Candee Furbish, MD;  Location: [facility] CATH LAB;  Service: Cardiovascular;  Laterality: N/A;  . MAXILLARY ANTROSTOMY  02/02/2012   Procedure: MAXILLARY ANTROSTOMY;  Surgeon: Ruby Cola, MD;  Location: [facility];  Service: ENT;  Laterality: Left;  Left Maxillary Antrostomy [id number]  . NASAL SINUS SURGERY  02/02/2012   Procedure: ENDOSCOPIC SINUS SURGERY WITH STEALTH;  Surgeon: Ruby Cola, MD;  Location: [facility];  Service: ENT;  Laterality: Left;  Left Frontal Sinusotomy [id number], and endoscopic resection of left anterior cranial fossa tumor [id number]  . other     Hemangiopericytoma, left nasal septum,repair of CSF leak  . SPHENOIDECTOMY  02/02/2012   Procedure: SPHENOIDECTOMY;  Surgeon: Ruby Cola, MD;  Location: [facility];  Service:  ENT;  Laterality: Left;  Left Sphenoidectomy [phone]  . TONSILLECTOMY     as a youngster    Current Outpatient Medications  Medication Sig Dispense Refill  . acetaminophen (TYLENOL) 500 MG tablet Take 1,000 mg by mouth every 6 (six) hours as needed for pain.    . carvedilol (COREG) 12.5 MG tablet TAKE 1 TABLET (12.5 MG TOTAL) BY MOUTH 2 (TWO) TIMES DAILY WITH A MEAL. 60 tablet 8  . glipiZIDE (GLUCOTROL) 10 MG tablet 1 TABLET TWICE A DAY FOR DIABETES ORALLY 90 DAYS  3  . levothyroxine (SYNTHROID, LEVOTHROID) 125 MCG tablet Take 125 mcg by mouth daily before breakfast.   2  . metFORMIN (GLUCOPHAGE) 500 MG tablet Patient takes 1 tablet by mouth in the morning and two tablets by mouth at night.    . Misc Natural Products (OSTEO BI-FLEX JOINT SHIELD PO) Take 1 tablet by mouth daily.     . Omega-3 Fatty Acids (FISH OIL) 1200 MG CAPS Take 1,200 mg by mouth daily.     . Plant Sterols and Stanols (CHOLEST OFF) 450 MG TABS Take 450 mg by mouth 2 (two) times daily.     Marland Kitchen SALINE NASAL SPRAY NA Place 1 spray into both nostrils daily as needed (allergies).    Marland Kitchen spironolactone (ALDACTONE) 25 MG tablet TAKE 1 TABLET BY MOUTH ONCE DAILY 90 tablet 3  . losartan (COZAAR) 25 MG tablet Take 1 tablet (25 mg total) by mouth daily. 90 tablet 3   No current facility-administered medications for this visit.     Allergies:    Allergies  Allergen Reactions  . Aspirin Other (See Comments)    GI distress  . Augmentin [Amoxicillin-Pot Clavulanate] Other (See Comments)    unknown  . Erythromycin Nausea And Vomiting  . Levaquin [Levofloxacin In D5w] Diarrhea    Social History:  The patient  reports that  has never smoked. she has never used smokeless tobacco. She reports that she does not drink alcohol or use drugs.   ROS:  Please see the history of present illness.   All others neg PHYSICAL EXAM: VS:  BP 106/76   Pulse 90   Ht 5\' 2"  (1.575 m)   Wt 201 lb 4 oz (91.3 kg)   LMP  (LMP Unknown)   BMI 36.81 kg/m    GEN: Well nourished, well developed, in no acute distress  HEENT: normal  Neck: no JVD, carotid bruits, or masses Cardiac: RRR; 2/6 SM, no rubs, or gallops,no edema  Respiratory:  clear to auscultation bilaterally, normal work of breathing GI: soft, nontender, nondistended, + BS MS: no deformity or atrophy  Skin: warm and dry, no rash Neuro:  Alert and Oriented x 3, Strength and sensation are intact Psych: euthymic mood, full affect  EKG:  EKG ordered on 07/10/17-sinus rhythm, PVC noted, subtle sinus arrhythmia, right axis deviation, poor R wave progression personally viewed-prior 04/11/16-sinus rhythm 85 left axis deviation, borderline low voltage, septal  infarct pattern personally viewed-prior 05/12/14-sinus rhythm, 75, nonspecific ST-T wave changes, low voltage Cardiac catheterization reviewed as above. Reassuring.     LABS: 5/14: Creat 0.7, LDL 69, K 4.4 Echocardiogram: 06/21/13-EF 25%.  ECHO 04/20/15 - 50-55%  ASSESSMENT AND PLAN:   Improved nonischemic cardiomyopathy  - EF originally was in the 25% range but at last check in 2016 November was low normal. Excellent.  - We will continue with carvedilol and losartan. Spironolactone as well.  We discussed the losartan recall.  - Low energy has been a common complaint since the beginning, continue to encourage exercise, weight loss. Her knee osteoarthritis is causing issues.  She is not wish to get knees replaced at this point.  -Previously asked me about Delene Loll -does not need it if her EF is now greater than 40. Cost was previously an issue  Low energy/morbid obesity (BMI greater than 35 with diabetes, cardiomyopathy)  - Continue to work on fatigue.  Continue to be active.  - Would hate to decrease her carvedilol. Blood pressure excellent. Cardiomyopathy resolved.  Paroxysmal atrial tachycardia  - Improved with carvedilol. Stopped previously used digoxin.  No further issues.  Handicap placard filled out  28-month  follow-up  Signed, Candee Furbish, MD [facility]  07/10/2017 9:54 AM

## 2017-07-10 NOTE — Patient Instructions (Signed)
Medication Instructions:  Your provider recommends that you continue on your current medications as directed. Please refer to the Current Medication list given to you today.    Labwork: None  Testing/Procedures: None  Follow-Up: Your provider wants you to follow-up in: 1 year with Dr. Skains. You will receive a reminder letter in the mail two months in advance. If you don't receive a letter, please call our office to schedule the follow-up appointment.    Any Other Special Instructions Will Be Listed Below (If Applicable).     If you need a refill on your cardiac medications before your next appointment, please call your pharmacy.   

## 2017-07-13 ENCOUNTER — Other Ambulatory Visit: Payer: Self-pay | Admitting: Cardiology

## 2017-08-28 ENCOUNTER — Other Ambulatory Visit: Payer: Self-pay | Admitting: Cardiology

## 2017-09-07 DIAGNOSIS — Z1211 Encounter for screening for malignant neoplasm of colon: Secondary | ICD-10-CM | POA: Diagnosis not present

## 2017-09-07 DIAGNOSIS — I5022 Chronic systolic (congestive) heart failure: Secondary | ICD-10-CM | POA: Diagnosis not present

## 2017-09-07 DIAGNOSIS — I10 Essential (primary) hypertension: Secondary | ICD-10-CM | POA: Diagnosis not present

## 2017-09-07 DIAGNOSIS — Z0000 Encounter for general adult medical examination without abnormal findings: Secondary | ICD-10-CM | POA: Diagnosis not present

## 2017-09-07 DIAGNOSIS — I429 Cardiomyopathy, unspecified: Secondary | ICD-10-CM | POA: Diagnosis not present

## 2017-09-07 DIAGNOSIS — E1142 Type 2 diabetes mellitus with diabetic polyneuropathy: Secondary | ICD-10-CM | POA: Diagnosis not present

## 2017-09-07 DIAGNOSIS — E039 Hypothyroidism, unspecified: Secondary | ICD-10-CM | POA: Diagnosis not present

## 2017-09-07 DIAGNOSIS — E7800 Pure hypercholesterolemia, unspecified: Secondary | ICD-10-CM | POA: Diagnosis not present

## 2017-09-12 ENCOUNTER — Other Ambulatory Visit: Payer: Self-pay | Admitting: Family Medicine

## 2017-09-12 DIAGNOSIS — Z1231 Encounter for screening mammogram for malignant neoplasm of breast: Secondary | ICD-10-CM

## 2017-09-19 DIAGNOSIS — S40262A Insect bite (nonvenomous) of left shoulder, initial encounter: Secondary | ICD-10-CM | POA: Diagnosis not present

## 2017-09-19 DIAGNOSIS — W57XXXA Bitten or stung by nonvenomous insect and other nonvenomous arthropods, initial encounter: Secondary | ICD-10-CM | POA: Diagnosis not present

## 2017-10-09 ENCOUNTER — Ambulatory Visit
Admission: RE | Admit: 2017-10-09 | Discharge: 2017-10-09 | Disposition: A | Discharge status: Home / Self Care | Payer: Medicare HMO | Source: Ambulatory Visit | Attending: Family Medicine | Admitting: Family Medicine

## 2017-10-09 DIAGNOSIS — Z1231 Encounter for screening mammogram for malignant neoplasm of breast: Secondary | ICD-10-CM

## 2017-10-23 DIAGNOSIS — J208 Acute bronchitis due to other specified organisms: Secondary | ICD-10-CM | POA: Diagnosis not present

## 2018-01-04 ENCOUNTER — Encounter: Payer: Self-pay | Admitting: Podiatry

## 2018-01-04 ENCOUNTER — Ambulatory Visit (INDEPENDENT_AMBULATORY_CARE_PROVIDER_SITE_OTHER): Payer: Medicare HMO

## 2018-01-04 ENCOUNTER — Ambulatory Visit: Payer: Medicare HMO | Admitting: Podiatry

## 2018-01-04 VITALS — BP 122/70 | HR 90 | Resp 16

## 2018-01-04 DIAGNOSIS — E1142 Type 2 diabetes mellitus with diabetic polyneuropathy: Secondary | ICD-10-CM

## 2018-01-04 MED ORDER — GABAPENTIN 100 MG PO CAPS: ORAL_CAPSULE | ORAL | 3 refills | Status: DC

## 2018-01-06 NOTE — Progress Notes (Signed)
Subjective:  Patient ID: Brittney Malone, female    DOB: [birth year],  MRN: [id number] HPI Chief Complaint  Patient presents with  . Foot Pain    Bilateral; plantar; pt stated, "I have neuropathy in both of my feet; they both feel numb"; x1+yrs; pt Diabetic Type 2; Sugar=did not check today; A1C=pt stated, "6.8"    77 y.o. female presents with the above complaint.  ROS: Denies fever chills nausea vomiting muscle aches pains calf pain back pain chest pain shortness of breath.  Past Medical History:  Diagnosis Date  . Arthritis    knees- OA, uses topical treatment   . Diabetes mellitus   . History of nuclear stress test    told results were WNL, - several yrs. ago    . Hypertension   . Hypothyroidism   . Neuromuscular disorder (HCC)    neuropathy- feet- bilateral   . Osteoarthritis   . Restless leg syndrome    Past Surgical History:  Procedure Laterality Date  . ABDOMINAL HYSTERECTOMY    . APPENDECTOMY    . Carpal Tunnel Repair    . CATARACT EXTRACTION, BILATERAL    . ETHMOIDECTOMY  02/02/2012   Procedure: ETHMOIDECTOMY;  Surgeon: Ruby Cola, MD;  Location: [facility];  Service: ENT;  Laterality: Left;  Left Ethmoidectomy [phone] with repair of CSF leak  . EYE SURGERY     cataracts (bilateral) - removed- /w IOL   . KNEE CARTILAGE SURGERY Left   . LEFT HEART CATHETERIZATION WITH CORONARY ANGIOGRAM N/A 04/02/2013   Procedure: LEFT HEART CATHETERIZATION WITH CORONARY ANGIOGRAM;  Surgeon: Candee Furbish, MD;  Location: [facility] CATH LAB;  Service: Cardiovascular;  Laterality: N/A;  . MAXILLARY ANTROSTOMY  02/02/2012   Procedure: MAXILLARY ANTROSTOMY;  Surgeon: Ruby Cola, MD;  Location: [facility];  Service: ENT;  Laterality: Left;  Left Maxillary Antrostomy [id number]  . NASAL SINUS SURGERY  02/02/2012   Procedure: ENDOSCOPIC SINUS SURGERY WITH STEALTH;  Surgeon: Ruby Cola, MD;  Location: [facility];  Service: ENT;  Laterality: Left;  Left Frontal Sinusotomy [id number], and endoscopic resection of left  anterior cranial fossa tumor [id number]  . other     Hemangiopericytoma, left nasal septum,repair of CSF leak  . SPHENOIDECTOMY  02/02/2012   Procedure: SPHENOIDECTOMY;  Surgeon: Ruby Cola, MD;  Location: [facility];  Service: ENT;  Laterality: Left;  Left Sphenoidectomy [phone]  . TONSILLECTOMY     as a youngster    Current Outpatient Medications:  .  acetaminophen (TYLENOL) 500 MG tablet, Take 1,000 mg by mouth every 6 (six) hours as needed for pain., Disp: , Rfl:  .  benzonatate (TESSALON) 100 MG capsule, 1 CAPSULE AS NEEDED THREE TIMES A DAY AS NEEDED FOR COUGH ORALLY 10 DAYS, Disp: , Rfl: 0 .  carvedilol (COREG) 12.5 MG tablet, TAKE 1 TABLET BY MOUTH 2 (TWO) TIMES DAILY WITH A MEAL., Disp: 180 tablet, Rfl: 3 .  glipiZIDE (GLUCOTROL) 10 MG tablet, 1 TABLET TWICE A DAY FOR DIABETES ORALLY 90 DAYS, Disp: , Rfl: 3 .  levothyroxine (SYNTHROID, LEVOTHROID) 125 MCG tablet, Take 125 mcg by mouth daily before breakfast. , Disp: , Rfl: 2 .  metFORMIN (GLUCOPHAGE) 500 MG tablet, Patient takes 1 tablet by mouth in the morning and two tablets by mouth at night., Disp: , Rfl:  .  metFORMIN (GLUCOPHAGE-XR) 500 MG 24 hr tablet, Take 500 mg by mouth 2 (two) times daily., Disp: , Rfl: 1 .  Misc Natural Products (OSTEO BI-FLEX JOINT SHIELD PO), Take 1 tablet  by mouth daily. , Disp: , Rfl:  .  Omega-3 Fatty Acids (FISH OIL) 1200 MG CAPS, Take 1,200 mg by mouth daily. , Disp: , Rfl:  .  Plant Sterols and Stanols (CHOLEST OFF) 450 MG TABS, Take 450 mg by mouth 2 (two) times daily. , Disp: , Rfl:  .  SALINE NASAL SPRAY NA, Place 1 spray into both nostrils daily as needed (allergies)., Disp: , Rfl:  .  spironolactone (ALDACTONE) 25 MG tablet, TAKE 1 TABLET BY MOUTH EVERY DAY, Disp: 90 tablet, Rfl: 3 .  gabapentin (NEURONTIN) 100 MG capsule, Take 1 capsule at breakfast, 1 capsule at lunch and 3 capsules at bedtime, Disp: 150 capsule, Rfl: 3 .  losartan (COZAAR) 25 MG tablet, Take 1 tablet (25 mg total) by mouth daily.,  Disp: 90 tablet, Rfl: 3  Allergies  Allergen Reactions  . Aspirin Other (See Comments)    GI distress  . Augmentin [Amoxicillin-Pot Clavulanate] Other (See Comments)    unknown  . Erythromycin Nausea And Vomiting  . Levaquin [Levofloxacin In D5w] Diarrhea   Review of Systems Objective:   Vitals:   01/04/18 1046  BP: 122/70  Pulse: 90  Resp: 16    General: Well developed, nourished, in no acute distress, alert and oriented x3   Dermatological: Skin is warm, dry and supple bilateral. Nails x 10 are well maintained; remaining integument appears unremarkable at this time. There are no open sores, no preulcerative lesions, no rash or signs of infection present.  Vascular: Dorsalis Pedis artery and Posterior Tibial artery pedal pulses are 2/4 bilateral with immedate capillary fill time. Pedal hair growth present. No varicosities and no lower extremity edema present bilateral.   Neruologic: Grossly intact via light touch bilateral. Vibratory intact via tuning fork bilateral. Protective threshold diminished per Semmes Wienstein monofilament  to all pedal sites bilateral. Patellar and Achilles deep tendon reflexes 2+ bilateral. No Babinski or clonus noted bilateral.   Musculoskeletal: No gross boney pedal deformities bilateral. No pain, crepitus, or limitation noted with foot and ankle range of motion bilateral. Muscular strength 5/5 in all groups tested bilateral.  Gait: Unassisted, Nonantalgic.    Radiographs:  Radiographs taken today demonstrate an osseously mature individual with some calcification of the Achilles tendon left.  Large posterior and plantar calcaneal heel spurs noted bilaterally.  Minimal osteoarthritic changes and no acute findings.  Assessment & Plan:   Assessment: Diabetic peripheral neuropathy  Plan: Encouraged her to start gabapentin 100 mg in the morning 100 mg at lunch and 300 mg at bedtime.  Follow-up with her in 1 month to 6 weeks.     Antione Obar T. [place],  [place]

## 2018-01-08 ENCOUNTER — Telehealth: Payer: Self-pay | Admitting: *Deleted

## 2018-01-08 ENCOUNTER — Other Ambulatory Visit: Payer: Self-pay | Admitting: Cardiology

## 2018-01-08 NOTE — Telephone Encounter (Signed)
Received notice for pre-cert on Gabapentin.

## 2018-01-09 ENCOUNTER — Telehealth: Payer: Self-pay | Admitting: Podiatry

## 2018-01-09 NOTE — Telephone Encounter (Signed)
I saw Dr. Milinda Pointer on 01 August and he prescribed gabapentin for me. I have not been able to pick that up and take as it is requiring a prior authorization. Can someone get back with me and let me know what I need to do? My number is [phone]. Thank you.

## 2018-01-09 NOTE — Telephone Encounter (Signed)
I informed pt if she would have her pharmacy fax the PA request to our office I would make sure it was given to the person that pre-certed.

## 2018-02-04 ENCOUNTER — Other Ambulatory Visit: Payer: Self-pay | Admitting: Podiatry

## 2018-02-08 ENCOUNTER — Encounter: Payer: Self-pay | Admitting: Podiatry

## 2018-02-08 ENCOUNTER — Ambulatory Visit: Payer: Medicare HMO | Admitting: Podiatry

## 2018-02-08 DIAGNOSIS — E1142 Type 2 diabetes mellitus with diabetic polyneuropathy: Secondary | ICD-10-CM

## 2018-02-08 DIAGNOSIS — M722 Plantar fascial fibromatosis: Secondary | ICD-10-CM

## 2018-02-08 MED ORDER — GABAPENTIN 300 MG PO CAPS: ORAL_CAPSULE | ORAL | 1 refills | Status: AC

## 2018-02-10 NOTE — Progress Notes (Signed)
She presents today for follow-up of her diabetic peripheral neuropathy.  States that is not been any change in the medication.  Objective: Vital signs are stable alert and oriented x3 no change in physical exam.  No open lesions or wounds.  Assessment: Diabetic peripheral neuropathy.  Plan: We will increase her gabapentin to 300 mg she will take 1 at breakfast one at lunch and 2 at bedtime.  I will follow-up with her in 1 month

## 2018-03-08 ENCOUNTER — Ambulatory Visit: Payer: Medicare HMO | Admitting: Podiatry

## 2018-03-19 DIAGNOSIS — I429 Cardiomyopathy, unspecified: Secondary | ICD-10-CM | POA: Diagnosis not present

## 2018-03-19 DIAGNOSIS — Z23 Encounter for immunization: Secondary | ICD-10-CM | POA: Diagnosis not present

## 2018-03-19 DIAGNOSIS — E039 Hypothyroidism, unspecified: Secondary | ICD-10-CM | POA: Diagnosis not present

## 2018-03-19 DIAGNOSIS — I10 Essential (primary) hypertension: Secondary | ICD-10-CM | POA: Diagnosis not present

## 2018-03-19 DIAGNOSIS — I5022 Chronic systolic (congestive) heart failure: Secondary | ICD-10-CM | POA: Diagnosis not present

## 2018-03-19 DIAGNOSIS — E1142 Type 2 diabetes mellitus with diabetic polyneuropathy: Secondary | ICD-10-CM | POA: Diagnosis not present

## 2018-03-19 DIAGNOSIS — E7800 Pure hypercholesterolemia, unspecified: Secondary | ICD-10-CM | POA: Diagnosis not present

## 2018-06-30 ENCOUNTER — Other Ambulatory Visit: Payer: Self-pay | Admitting: Cardiology

## 2018-08-17 ENCOUNTER — Encounter: Payer: Self-pay | Admitting: Cardiology

## 2018-08-25 ENCOUNTER — Other Ambulatory Visit: Payer: Self-pay | Admitting: Cardiology

## 2018-08-27 ENCOUNTER — Ambulatory Visit: Payer: Medicare HMO | Admitting: Cardiology

## 2018-08-29 ENCOUNTER — Ambulatory Visit: Payer: Medicare HMO | Admitting: Cardiology

## 2018-09-24 ENCOUNTER — Other Ambulatory Visit: Payer: Self-pay | Admitting: Cardiology

## 2018-10-22 ENCOUNTER — Ambulatory Visit: Payer: Medicare HMO | Admitting: Cardiology

## 2018-10-23 ENCOUNTER — Ambulatory Visit: Payer: Medicare HMO | Admitting: Cardiology

## 2018-11-23 ENCOUNTER — Other Ambulatory Visit: Payer: Self-pay

## 2018-11-23 ENCOUNTER — Telehealth: Payer: Self-pay

## 2018-11-23 ENCOUNTER — Ambulatory Visit: Payer: Medicare HMO | Admitting: Cardiology

## 2018-11-23 ENCOUNTER — Encounter: Payer: Self-pay | Admitting: Cardiology

## 2018-11-23 VITALS — BP 116/72 | HR 93 | Ht 62.0 in | Wt 197.8 lb

## 2018-11-23 DIAGNOSIS — I471 Supraventricular tachycardia: Secondary | ICD-10-CM | POA: Diagnosis not present

## 2018-11-23 DIAGNOSIS — I5022 Chronic systolic (congestive) heart failure: Secondary | ICD-10-CM | POA: Diagnosis not present

## 2018-11-23 DIAGNOSIS — E7800 Pure hypercholesterolemia, unspecified: Secondary | ICD-10-CM | POA: Diagnosis not present

## 2018-11-23 NOTE — Telephone Encounter (Signed)
    COVID-19 Pre-Screening Questions:  . In the past 7 to 10 days have you had a cough,  shortness of breath, headache, congestion, fever (100 or greater) body aches, chills, sore throat, or sudden loss of taste or sense of smell? . Have you been around anyone with known Covid 19? NO . Have you been around anyone who is awaiting Covid 19 test results in the past 7 to 10 days? NO . Have you been around anyone who has been exposed to Covid 19, or has mentioned symptoms of Covid 19 within the past 7 to 10 days? NO  If you have any concerns/questions about symptoms patients report during screening (either on the phone or at threshold). Contact the provider seeing the patient or DOD for further guidance.  If neither are available contact a member of the leadership team.

## 2018-11-23 NOTE — Progress Notes (Signed)
Cardiology Office Note:    Date:  11/23/2018   ID:  Brittney Malone, DOB 1940-10-09, MRN [id number]  PCP:  [name], MD  Cardiologist:  No primary care provider on file.  Electrophysiologist:  None   Referring MD: [name], MD   Here for follow-up of paroxysmal atrial tachycardia  History of Present Illness:    Brittney Malone is a 78 y.o. female  dilated cardiomyopathy, nonischemic, EF 30% heart catheterization 04/02/13 here for followup. Possible viral etiology.  Previously attempted to up titrate medications, could not tolerate higher dose of carvedilol or lisinopril. No dyspnea. No orthopnea.   Low energy. 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 Lasix every other day.   She could not tolerate 25 mg of carvedilol twice a day. She felt fatigued as well as some chest discomfort with this she states. She's also had intermittent diarrhea. No significant shortness of breath currently. Her son has a cardiomyopathy as well.  She has had a robust response to Lasix. She is taking this every other day.  Prior recurrent bouts of pneumonia.   11/06/14 - arthritis in knees. Cortisone. Breath ok. No cp, syncope. Recent cold. Both of her sons have heart failure. They are seen in HF clinic  03/02/15-she is willing once again to try the [facility]. See below. Still feels fatigue, weakness. No syncope, mild shortness of breath with activity. Both of her sons are on this medication and feels so much better.  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 Some ankle swelling.   Entresto too expensive. No signal in shortness of breath, no syncope, no chest pain. His main complaint is fatigue. She has gained 7 pounds. She states that she has had many birthday parties. Trace ankle edema. Remember, prior ejection fraction normal.  10/17/16 no significant changes from prior visit. No chest pain, no significant shortness of breath. Still having fatigue. Weight has increased.   07/10/17-overall has been stable, no significant changes.  Still taking care of grandchildren.  69-year-old at home.  Can feel shortness of breath with some activity, fatigue.  Knee arthritis main complaint.  No orthopnea PND syncope bleeding.    Past Medical History:  Diagnosis Date  . Arthritis    knees- OA, uses topical treatment   . Diabetes mellitus   . History of nuclear stress test    told results were WNL, - several yrs. ago    . Hypertension   . Hypothyroidism   . Neuromuscular disorder (HCC)    neuropathy- feet- bilateral   . Osteoarthritis   . Restless leg syndrome     Past Surgical History:  Procedure Laterality Date  . ABDOMINAL HYSTERECTOMY    . APPENDECTOMY    . Carpal Tunnel Repair    . CATARACT EXTRACTION, BILATERAL    . ETHMOIDECTOMY  02/02/2012   Procedure: ETHMOIDECTOMY;  Surgeon: Ruby Cola, MD;  Location: [place];  Service: ENT;  Laterality: Left;  Left Ethmoidectomy [phone] with repair of CSF leak  . EYE SURGERY     cataracts (bilateral) - removed- /w IOL   . KNEE CARTILAGE SURGERY Left   . LEFT HEART CATHETERIZATION WITH CORONARY ANGIOGRAM N/A 04/02/2013   Procedure: LEFT HEART CATHETERIZATION WITH CORONARY ANGIOGRAM;  Surgeon: Candee Furbish, MD;  Location: [facility] C[facility] LAB;  Service: Cardiovascular;  Laterality: N/A;  . MAXILLARY ANTROSTOMY  02/02/2012   Procedure: MAXILLARY ANTROSTOMY;  Surgeon: Ruby Cola, MD;  Location: [place];  Service: ENT;  Laterality: Left;  Left Maxillary Antrostomy [id number]  .  NASAL SINUS SURGERY  02/02/2012   Procedure: ENDOSCOPIC SINUS SURGERY WITH STEALTH;  Surgeon: Ruby Cola, MD;  Location: [place];  Service: ENT;  Laterality: Left;  Left Frontal Sinusotomy [id number], and endoscopic resection of left anterior cranial fossa tumor [id number]  . other     Hemangiopericytoma, left nasal septum,repair of CSF leak  . SPHENOIDECTOMY  02/02/2012   Procedure: SPHENOIDECTOMY;  Surgeon: Ruby Cola, MD;  Location: [place];  Service: ENT;  Laterality:  Left;  Left Sphenoidectomy [phone]  . TONSILLECTOMY     as a youngster    Current Medications: Current Meds  Medication Sig  . acetaminophen (TYLENOL) 500 MG tablet Take 1,000 mg by mouth every 6 (six) hours as needed for pain.  . carvedilol (COREG) 12.5 MG tablet TAKE 1 TABLET BY MOUTH 2 TIMES DAILY WITH A MEAL.  Marland Kitchen gabapentin (NEURONTIN) 300 MG capsule Take one capsule AM, one capsule at lunch and 2 capsules at bedtime  . glipiZIDE (GLUCOTROL) 10 MG tablet 1 TABLET TWICE A DAY FOR DIABETES ORALLY 90 DAYS  . levothyroxine (SYNTHROID, LEVOTHROID) 125 MCG tablet Take 125 mcg by mouth daily before breakfast.   . losartan (COZAAR) 25 MG tablet TAKE 1 TABLET (25 MG TOTAL) BY MOUTH DAILY. KEEP UPCOMING APPT IN [facility] FOR FUTURE REFILLS.  . metFORMIN (GLUCOPHAGE) 500 MG tablet Patient takes 1 tablet by mouth in the morning and two tablets by mouth at night.  . metFORMIN (GLUCOPHAGE-XR) 500 MG 24 hr tablet Take 500 mg by mouth 2 (two) times daily.  . Misc Natural Products (OSTEO BI-FLEX JOINT SHIELD PO) Take 1 tablet by mouth daily.   . Omega-3 Fatty Acids (FISH OIL) 1200 MG CAPS Take 1,200 mg by mouth daily.   . Plant Sterols and Stanols (CHOLEST OFF) 450 MG TABS Take 450 mg by mouth 2 (two) times daily.   Marland Kitchen spironolactone (ALDACTONE) 25 MG tablet TAKE 1 TABLET (25 MG TOTAL) BY MOUTH DAILY. KEEP UPCOMING APPT IN [facility] FOR FUTURE REFILLS.     Allergies:   Aspirin, Augmentin [amoxicillin-pot clavulanate], Erythromycin, and Levaquin [levofloxacin in d5w]   Social History   Socioeconomic History  . Marital status: Married    Spouse name: Not on file  . Number of children: Not on file  . Years of education: Not on file  . Highest education level: Not on file  Occupational History  . Not on file  Social Needs  . Financial resource strain: Not on file  . Food insecurity    Worry: Not on file    Inability: Not on file  . Transportation needs    Medical: Not on file    Non-medical: Not on file   Tobacco Use  . Smoking status: Never Smoker  . Smokeless tobacco: Never Used  Substance and Sexual Activity  . Alcohol use: No  . Drug use: No  . Sexual activity: Not on file  Lifestyle  . Physical activity    Days per week: Not on file    Minutes per session: Not on file  . Stress: Not on file  Relationships  . Social Herbalist on phone: Not on file    Gets together: Not on file    Attends religious service: Not on file    Active member of club or organization: Not on file    Attends meetings of clubs or organizations: Not on file    Relationship status: Not on file  Other Topics Concern  . Not on file  Social History Narrative  . Not on file     Family History: [name] family history includes Arrhythmia in her brother, mother, and sister; Diabetes in her mother and sister; Heart attack in her brother, mother, and sister; Heart disease in her mother; Heart failure in her mother and sister; Hyperlipidemia in her sister; Hypertension in her father and sister; Thyroid disease in her sister. There is no history of Stroke.  ROS:   Please see the history of present illness.     All other systems reviewed and are negative.  EKGs/Labs/Other Studies Reviewed:    The following studies were reviewed today:  Echocardiogram: 06/21/13-EF 25%.  ECHO 04/20/15 - 50-55%  EKG:  EKG is  ordered today.  The ekg ordered today demonstrates sinus rhythm 93 inferior infarct with no other changes.  Personally reviewed  Recent Labs: No results found for requested labs within last 8760 hours.  Recent Lipid Panel No results found for: CHOL, TRIG, HDL, CHOLHDL, VLDL, LDLCALC, LDLDIRECT  Physical Exam:    VS:  BP 116/72   Pulse 93   Ht 5\' 2"  (1.575 m)   Wt 197 lb 12.8 oz (89.7 kg)   LMP  (LMP Unknown)   BMI 36.18 kg/m     Wt Readings from Last 3 Encounters:  11/23/18 197 lb 12.8 oz (89.7 kg)  07/10/17 201 lb 4 oz (91.3 kg)  10/17/16 198 lb (89.8 kg)     GEN:  Well  nourished, well developed in no acute distress HEENT: Normal NECK: No JVD; No carotid bruits LYMPHATICS: No lymphadenopathy CARDIAC: RRR, 2/6 murmur, no rubs, gallops RESPIRATORY:  Clear to auscultation without rales, wheezing or rhonchi  ABDOMEN: Soft, non-tender, non-distended MUSCULOSKELETAL:  No edema; No deformity  SKIN: Warm and dry NEUROLOGIC:  Alert and oriented x 3 PSYCHIATRIC:  Normal affect   ASSESSMENT:    No diagnosis found. PLAN:    In order of problems listed above:  Improved nonischemic cardiomyopathy  - EF originally was in the 25% range but at last check in 2016 November was low normal. Excellent.  - We will continue with carvedilol and losartan. Spironolactone as well.    - Low energy has been a common complaint since the beginning, continue to encourage exercise, weight loss. Her knee osteoarthritis is causing issues.  She does not wish to get knees replaced at this point.  -Previously asked me about Delene Loll -does not need it if her EF is now greater than 40. Cost was previously an issue.  -Continue with current plan.  Medications reviewed.  No changes.  Low energy/morbid obesity (BMI greater than 35 with diabetes, cardiomyopathy)  - Continue to work on fatigue.  Continue to be active.  Movement.  - Would hate to decrease her carvedilol. Blood pressure excellent. Cardiomyopathy resolved.  Excellent.  Paroxysmal atrial tachycardia  - Improved with carvedilol. Stopped previously used digoxin.  No further issues.  Heart rate currently 93.  No changes made.  Handicap placard filled out last year   Medication Adjustments/Labs and Tests Ordered: Current medicines are reviewed at length with the patient today.  Concerns regarding medicines are outlined above.  No orders of the defined types were placed in this encounter.  No orders of the defined types were placed in this encounter.   There are no Patient Instructions on file for this visit.   Signed,  Candee Furbish, MD  11/23/2018 11:44 AM    [facility]

## 2018-11-23 NOTE — Patient Instructions (Signed)
Medication Instructions:  The current medical regimen is effective;  continue present plan and medications.  If you need a refill on your cardiac medications before your next appointment, please call your pharmacy.   Follow-Up: At [facility], you and your health needs are our priority.  As part of our continuing mission to provide you with exceptional heart care, we have created designated Provider Care Teams.  These Care Teams include your primary Cardiologist (physician) and Advanced Practice Providers (APPs -  Physician Assistants and Nurse Practitioners) who all work together to provide you with the care you need, when you need it. You will need a follow up appointment in 12 months.  Please call our office 2 months in advance to schedule this appointment.  You may see Dr Skains. or one of the following Advanced Practice Providers on your designated Care Team:   Lori Gerhardt, NP Laura Ingold, NP . Jill McDaniel, NP  Thank you for choosing  [facility]!!      

## 2018-11-23 NOTE — Telephone Encounter (Signed)

## 2018-11-28 ENCOUNTER — Other Ambulatory Visit: Payer: Self-pay | Admitting: Cardiology

## 2018-12-16 ENCOUNTER — Other Ambulatory Visit: Payer: Self-pay | Admitting: Cardiology

## 2018-12-19 DIAGNOSIS — I10 Essential (primary) hypertension: Secondary | ICD-10-CM | POA: Diagnosis not present

## 2018-12-19 DIAGNOSIS — E7800 Pure hypercholesterolemia, unspecified: Secondary | ICD-10-CM | POA: Diagnosis not present

## 2018-12-19 DIAGNOSIS — E785 Hyperlipidemia, unspecified: Secondary | ICD-10-CM | POA: Diagnosis not present

## 2018-12-19 DIAGNOSIS — M179 Osteoarthritis of knee, unspecified: Secondary | ICD-10-CM | POA: Diagnosis not present

## 2018-12-19 DIAGNOSIS — E1142 Type 2 diabetes mellitus with diabetic polyneuropathy: Secondary | ICD-10-CM | POA: Diagnosis not present

## 2018-12-19 DIAGNOSIS — I5022 Chronic systolic (congestive) heart failure: Secondary | ICD-10-CM | POA: Diagnosis not present

## 2018-12-19 DIAGNOSIS — Z7984 Long term (current) use of oral hypoglycemic drugs: Secondary | ICD-10-CM | POA: Diagnosis not present

## 2018-12-19 DIAGNOSIS — E039 Hypothyroidism, unspecified: Secondary | ICD-10-CM | POA: Diagnosis not present

## 2019-02-19 DIAGNOSIS — Z23 Encounter for immunization: Secondary | ICD-10-CM | POA: Diagnosis not present

## 2019-03-11 ENCOUNTER — Other Ambulatory Visit: Payer: Self-pay | Admitting: Cardiology

## 2019-04-01 DIAGNOSIS — E7800 Pure hypercholesterolemia, unspecified: Secondary | ICD-10-CM | POA: Diagnosis not present

## 2019-04-01 DIAGNOSIS — E1142 Type 2 diabetes mellitus with diabetic polyneuropathy: Secondary | ICD-10-CM | POA: Diagnosis not present

## 2019-04-01 DIAGNOSIS — E039 Hypothyroidism, unspecified: Secondary | ICD-10-CM | POA: Diagnosis not present

## 2019-04-01 DIAGNOSIS — Z1211 Encounter for screening for malignant neoplasm of colon: Secondary | ICD-10-CM | POA: Diagnosis not present

## 2019-04-01 DIAGNOSIS — I5022 Chronic systolic (congestive) heart failure: Secondary | ICD-10-CM | POA: Diagnosis not present

## 2019-04-01 DIAGNOSIS — I10 Essential (primary) hypertension: Secondary | ICD-10-CM | POA: Diagnosis not present

## 2019-04-01 DIAGNOSIS — Z0000 Encounter for general adult medical examination without abnormal findings: Secondary | ICD-10-CM | POA: Diagnosis not present

## 2019-08-12 DIAGNOSIS — E039 Hypothyroidism, unspecified: Secondary | ICD-10-CM | POA: Diagnosis not present

## 2019-08-12 DIAGNOSIS — I10 Essential (primary) hypertension: Secondary | ICD-10-CM | POA: Diagnosis not present

## 2019-08-12 DIAGNOSIS — E7800 Pure hypercholesterolemia, unspecified: Secondary | ICD-10-CM | POA: Diagnosis not present

## 2019-08-12 DIAGNOSIS — E1142 Type 2 diabetes mellitus with diabetic polyneuropathy: Secondary | ICD-10-CM | POA: Diagnosis not present

## 2019-10-31 DIAGNOSIS — E039 Hypothyroidism, unspecified: Secondary | ICD-10-CM | POA: Diagnosis not present

## 2019-10-31 DIAGNOSIS — I10 Essential (primary) hypertension: Secondary | ICD-10-CM | POA: Diagnosis not present

## 2019-10-31 DIAGNOSIS — E7800 Pure hypercholesterolemia, unspecified: Secondary | ICD-10-CM | POA: Diagnosis not present

## 2019-10-31 DIAGNOSIS — I5022 Chronic systolic (congestive) heart failure: Secondary | ICD-10-CM | POA: Diagnosis not present

## 2019-10-31 DIAGNOSIS — E1142 Type 2 diabetes mellitus with diabetic polyneuropathy: Secondary | ICD-10-CM | POA: Diagnosis not present

## 2019-10-31 DIAGNOSIS — M179 Osteoarthritis of knee, unspecified: Secondary | ICD-10-CM | POA: Diagnosis not present

## 2019-10-31 DIAGNOSIS — E785 Hyperlipidemia, unspecified: Secondary | ICD-10-CM | POA: Diagnosis not present

## 2019-11-03 ENCOUNTER — Other Ambulatory Visit: Payer: Self-pay | Admitting: Cardiology

## 2019-12-02 DIAGNOSIS — E7800 Pure hypercholesterolemia, unspecified: Secondary | ICD-10-CM | POA: Diagnosis not present

## 2019-12-02 DIAGNOSIS — I5022 Chronic systolic (congestive) heart failure: Secondary | ICD-10-CM | POA: Diagnosis not present

## 2019-12-02 DIAGNOSIS — I10 Essential (primary) hypertension: Secondary | ICD-10-CM | POA: Diagnosis not present

## 2019-12-02 DIAGNOSIS — E1142 Type 2 diabetes mellitus with diabetic polyneuropathy: Secondary | ICD-10-CM | POA: Diagnosis not present

## 2019-12-02 DIAGNOSIS — L308 Other specified dermatitis: Secondary | ICD-10-CM | POA: Diagnosis not present

## 2019-12-02 DIAGNOSIS — R413 Other amnesia: Secondary | ICD-10-CM | POA: Diagnosis not present

## 2019-12-02 DIAGNOSIS — Z7984 Long term (current) use of oral hypoglycemic drugs: Secondary | ICD-10-CM | POA: Diagnosis not present

## 2019-12-02 DIAGNOSIS — M179 Osteoarthritis of knee, unspecified: Secondary | ICD-10-CM | POA: Diagnosis not present

## 2019-12-02 DIAGNOSIS — E039 Hypothyroidism, unspecified: Secondary | ICD-10-CM | POA: Diagnosis not present

## 2019-12-03 ENCOUNTER — Other Ambulatory Visit: Payer: Self-pay | Admitting: Cardiology

## 2019-12-03 DIAGNOSIS — E039 Hypothyroidism, unspecified: Secondary | ICD-10-CM | POA: Diagnosis not present

## 2019-12-03 DIAGNOSIS — M179 Osteoarthritis of knee, unspecified: Secondary | ICD-10-CM | POA: Diagnosis not present

## 2019-12-03 DIAGNOSIS — E1142 Type 2 diabetes mellitus with diabetic polyneuropathy: Secondary | ICD-10-CM | POA: Diagnosis not present

## 2019-12-03 DIAGNOSIS — E7800 Pure hypercholesterolemia, unspecified: Secondary | ICD-10-CM | POA: Diagnosis not present

## 2019-12-03 DIAGNOSIS — E785 Hyperlipidemia, unspecified: Secondary | ICD-10-CM | POA: Diagnosis not present

## 2019-12-03 DIAGNOSIS — I5022 Chronic systolic (congestive) heart failure: Secondary | ICD-10-CM | POA: Diagnosis not present

## 2019-12-03 DIAGNOSIS — I10 Essential (primary) hypertension: Secondary | ICD-10-CM | POA: Diagnosis not present

## 2020-01-01 ENCOUNTER — Other Ambulatory Visit: Payer: Self-pay | Admitting: Cardiology

## 2020-01-01 DIAGNOSIS — I10 Essential (primary) hypertension: Secondary | ICD-10-CM | POA: Diagnosis not present

## 2020-01-01 DIAGNOSIS — E039 Hypothyroidism, unspecified: Secondary | ICD-10-CM | POA: Diagnosis not present

## 2020-01-01 DIAGNOSIS — E1142 Type 2 diabetes mellitus with diabetic polyneuropathy: Secondary | ICD-10-CM | POA: Diagnosis not present

## 2020-01-01 DIAGNOSIS — E785 Hyperlipidemia, unspecified: Secondary | ICD-10-CM | POA: Diagnosis not present

## 2020-01-01 DIAGNOSIS — M179 Osteoarthritis of knee, unspecified: Secondary | ICD-10-CM | POA: Diagnosis not present

## 2020-01-01 DIAGNOSIS — I5022 Chronic systolic (congestive) heart failure: Secondary | ICD-10-CM | POA: Diagnosis not present

## 2020-01-01 DIAGNOSIS — E7800 Pure hypercholesterolemia, unspecified: Secondary | ICD-10-CM | POA: Diagnosis not present

## 2020-01-23 DIAGNOSIS — E1142 Type 2 diabetes mellitus with diabetic polyneuropathy: Secondary | ICD-10-CM | POA: Diagnosis not present

## 2020-01-23 DIAGNOSIS — E785 Hyperlipidemia, unspecified: Secondary | ICD-10-CM | POA: Diagnosis not present

## 2020-01-23 DIAGNOSIS — I10 Essential (primary) hypertension: Secondary | ICD-10-CM | POA: Diagnosis not present

## 2020-01-23 DIAGNOSIS — E7800 Pure hypercholesterolemia, unspecified: Secondary | ICD-10-CM | POA: Diagnosis not present

## 2020-01-23 DIAGNOSIS — E039 Hypothyroidism, unspecified: Secondary | ICD-10-CM | POA: Diagnosis not present

## 2020-01-23 DIAGNOSIS — I5022 Chronic systolic (congestive) heart failure: Secondary | ICD-10-CM | POA: Diagnosis not present

## 2020-01-23 DIAGNOSIS — M179 Osteoarthritis of knee, unspecified: Secondary | ICD-10-CM | POA: Diagnosis not present

## 2020-02-15 ENCOUNTER — Other Ambulatory Visit: Payer: Self-pay | Admitting: Cardiology

## 2020-03-01 ENCOUNTER — Other Ambulatory Visit: Payer: Self-pay | Admitting: Cardiology

## 2020-03-02 DIAGNOSIS — E785 Hyperlipidemia, unspecified: Secondary | ICD-10-CM | POA: Diagnosis not present

## 2020-03-02 DIAGNOSIS — E039 Hypothyroidism, unspecified: Secondary | ICD-10-CM | POA: Diagnosis not present

## 2020-03-02 DIAGNOSIS — E1142 Type 2 diabetes mellitus with diabetic polyneuropathy: Secondary | ICD-10-CM | POA: Diagnosis not present

## 2020-03-02 DIAGNOSIS — I5022 Chronic systolic (congestive) heart failure: Secondary | ICD-10-CM | POA: Diagnosis not present

## 2020-03-02 DIAGNOSIS — M179 Osteoarthritis of knee, unspecified: Secondary | ICD-10-CM | POA: Diagnosis not present

## 2020-03-02 DIAGNOSIS — I10 Essential (primary) hypertension: Secondary | ICD-10-CM | POA: Diagnosis not present

## 2020-03-02 DIAGNOSIS — E7800 Pure hypercholesterolemia, unspecified: Secondary | ICD-10-CM | POA: Diagnosis not present

## 2020-03-16 ENCOUNTER — Other Ambulatory Visit: Payer: Self-pay | Admitting: Cardiology

## 2020-03-31 DIAGNOSIS — I10 Essential (primary) hypertension: Secondary | ICD-10-CM | POA: Diagnosis not present

## 2020-03-31 DIAGNOSIS — M179 Osteoarthritis of knee, unspecified: Secondary | ICD-10-CM | POA: Diagnosis not present

## 2020-03-31 DIAGNOSIS — E1142 Type 2 diabetes mellitus with diabetic polyneuropathy: Secondary | ICD-10-CM | POA: Diagnosis not present

## 2020-03-31 DIAGNOSIS — I5022 Chronic systolic (congestive) heart failure: Secondary | ICD-10-CM | POA: Diagnosis not present

## 2020-03-31 DIAGNOSIS — E039 Hypothyroidism, unspecified: Secondary | ICD-10-CM | POA: Diagnosis not present

## 2020-03-31 DIAGNOSIS — E7800 Pure hypercholesterolemia, unspecified: Secondary | ICD-10-CM | POA: Diagnosis not present

## 2020-03-31 DIAGNOSIS — E785 Hyperlipidemia, unspecified: Secondary | ICD-10-CM | POA: Diagnosis not present

## 2020-04-24 DIAGNOSIS — M179 Osteoarthritis of knee, unspecified: Secondary | ICD-10-CM | POA: Diagnosis not present

## 2020-04-24 DIAGNOSIS — E039 Hypothyroidism, unspecified: Secondary | ICD-10-CM | POA: Diagnosis not present

## 2020-04-24 DIAGNOSIS — E7800 Pure hypercholesterolemia, unspecified: Secondary | ICD-10-CM | POA: Diagnosis not present

## 2020-04-24 DIAGNOSIS — I5022 Chronic systolic (congestive) heart failure: Secondary | ICD-10-CM | POA: Diagnosis not present

## 2020-04-24 DIAGNOSIS — E1142 Type 2 diabetes mellitus with diabetic polyneuropathy: Secondary | ICD-10-CM | POA: Diagnosis not present

## 2020-04-24 DIAGNOSIS — I10 Essential (primary) hypertension: Secondary | ICD-10-CM | POA: Diagnosis not present

## 2020-04-24 DIAGNOSIS — E785 Hyperlipidemia, unspecified: Secondary | ICD-10-CM | POA: Diagnosis not present

## 2020-05-11 ENCOUNTER — Other Ambulatory Visit: Payer: Self-pay

## 2020-05-11 ENCOUNTER — Telehealth: Payer: Medicare HMO | Admitting: Cardiology

## 2020-05-14 ENCOUNTER — Ambulatory Visit: Payer: Medicare HMO | Admitting: Cardiology

## 2020-05-22 ENCOUNTER — Ambulatory Visit: Payer: Medicare HMO | Admitting: Cardiology

## 2020-05-27 DIAGNOSIS — I5022 Chronic systolic (congestive) heart failure: Secondary | ICD-10-CM | POA: Diagnosis not present

## 2020-05-27 DIAGNOSIS — E7800 Pure hypercholesterolemia, unspecified: Secondary | ICD-10-CM | POA: Diagnosis not present

## 2020-05-27 DIAGNOSIS — E039 Hypothyroidism, unspecified: Secondary | ICD-10-CM | POA: Diagnosis not present

## 2020-05-27 DIAGNOSIS — E785 Hyperlipidemia, unspecified: Secondary | ICD-10-CM | POA: Diagnosis not present

## 2020-05-27 DIAGNOSIS — I10 Essential (primary) hypertension: Secondary | ICD-10-CM | POA: Diagnosis not present

## 2020-05-27 DIAGNOSIS — E1142 Type 2 diabetes mellitus with diabetic polyneuropathy: Secondary | ICD-10-CM | POA: Diagnosis not present

## 2020-05-27 DIAGNOSIS — M179 Osteoarthritis of knee, unspecified: Secondary | ICD-10-CM | POA: Diagnosis not present

## 2020-06-30 ENCOUNTER — Ambulatory Visit: Payer: Medicare HMO | Admitting: Cardiology

## 2020-06-30 DIAGNOSIS — E7800 Pure hypercholesterolemia, unspecified: Secondary | ICD-10-CM | POA: Diagnosis not present

## 2020-06-30 DIAGNOSIS — E1142 Type 2 diabetes mellitus with diabetic polyneuropathy: Secondary | ICD-10-CM | POA: Diagnosis not present

## 2020-06-30 DIAGNOSIS — I10 Essential (primary) hypertension: Secondary | ICD-10-CM | POA: Diagnosis not present

## 2020-06-30 DIAGNOSIS — M179 Osteoarthritis of knee, unspecified: Secondary | ICD-10-CM | POA: Diagnosis not present

## 2020-06-30 DIAGNOSIS — E039 Hypothyroidism, unspecified: Secondary | ICD-10-CM | POA: Diagnosis not present

## 2020-06-30 DIAGNOSIS — E785 Hyperlipidemia, unspecified: Secondary | ICD-10-CM | POA: Diagnosis not present

## 2020-06-30 DIAGNOSIS — I5022 Chronic systolic (congestive) heart failure: Secondary | ICD-10-CM | POA: Diagnosis not present

## 2020-07-27 DIAGNOSIS — E785 Hyperlipidemia, unspecified: Secondary | ICD-10-CM | POA: Diagnosis not present

## 2020-07-27 DIAGNOSIS — I10 Essential (primary) hypertension: Secondary | ICD-10-CM | POA: Diagnosis not present

## 2020-07-27 DIAGNOSIS — M179 Osteoarthritis of knee, unspecified: Secondary | ICD-10-CM | POA: Diagnosis not present

## 2020-07-27 DIAGNOSIS — I5022 Chronic systolic (congestive) heart failure: Secondary | ICD-10-CM | POA: Diagnosis not present

## 2020-07-27 DIAGNOSIS — E039 Hypothyroidism, unspecified: Secondary | ICD-10-CM | POA: Diagnosis not present

## 2020-07-27 DIAGNOSIS — E7800 Pure hypercholesterolemia, unspecified: Secondary | ICD-10-CM | POA: Diagnosis not present

## 2020-07-27 DIAGNOSIS — E1142 Type 2 diabetes mellitus with diabetic polyneuropathy: Secondary | ICD-10-CM | POA: Diagnosis not present

## 2020-07-30 ENCOUNTER — Ambulatory Visit: Payer: Medicare HMO | Admitting: Cardiology

## 2020-08-25 DIAGNOSIS — I5022 Chronic systolic (congestive) heart failure: Secondary | ICD-10-CM | POA: Diagnosis not present

## 2020-08-25 DIAGNOSIS — I10 Essential (primary) hypertension: Secondary | ICD-10-CM | POA: Diagnosis not present

## 2020-08-25 DIAGNOSIS — E785 Hyperlipidemia, unspecified: Secondary | ICD-10-CM | POA: Diagnosis not present

## 2020-08-25 DIAGNOSIS — E7800 Pure hypercholesterolemia, unspecified: Secondary | ICD-10-CM | POA: Diagnosis not present

## 2020-08-25 DIAGNOSIS — E039 Hypothyroidism, unspecified: Secondary | ICD-10-CM | POA: Diagnosis not present

## 2020-08-25 DIAGNOSIS — E1142 Type 2 diabetes mellitus with diabetic polyneuropathy: Secondary | ICD-10-CM | POA: Diagnosis not present

## 2020-08-25 DIAGNOSIS — M179 Osteoarthritis of knee, unspecified: Secondary | ICD-10-CM | POA: Diagnosis not present

## 2020-09-10 ENCOUNTER — Other Ambulatory Visit: Payer: Self-pay

## 2020-09-10 ENCOUNTER — Ambulatory Visit: Payer: Medicare HMO | Admitting: Cardiology

## 2020-09-10 ENCOUNTER — Encounter: Payer: Self-pay | Admitting: Cardiology

## 2020-09-10 VITALS — BP 114/70 | HR 81 | Ht 62.0 in | Wt 185.0 lb

## 2020-09-10 DIAGNOSIS — R5383 Other fatigue: Secondary | ICD-10-CM

## 2020-09-10 DIAGNOSIS — Z79899 Other long term (current) drug therapy: Secondary | ICD-10-CM | POA: Diagnosis not present

## 2020-09-10 DIAGNOSIS — E7800 Pure hypercholesterolemia, unspecified: Secondary | ICD-10-CM

## 2020-09-10 DIAGNOSIS — I5022 Chronic systolic (congestive) heart failure: Secondary | ICD-10-CM | POA: Diagnosis not present

## 2020-09-10 DIAGNOSIS — I471 Supraventricular tachycardia: Secondary | ICD-10-CM

## 2020-09-10 DIAGNOSIS — I351 Nonrheumatic aortic (valve) insufficiency: Secondary | ICD-10-CM | POA: Diagnosis not present

## 2020-09-10 LAB — CBC
Hematocrit: 45.6 % (ref 34.0–46.6)
Hemoglobin: 15.2 g/dL (ref 11.1–15.9)
MCH: 31.2 pg (ref 26.6–33.0)
MCHC: 33.3 g/dL (ref 31.5–35.7)
MCV: 94 fL (ref 79–97)
Platelets: 297 10*3/uL (ref 150–450)
RBC: 4.87 x10E6/uL (ref 3.77–5.28)
RDW: 12.1 % (ref 11.7–15.4)
WBC: 8.6 10*3/uL (ref 3.4–10.8)

## 2020-09-10 LAB — COMPREHENSIVE METABOLIC PANEL
ALT: 16 IU/L (ref 0–32)
AST: 17 IU/L (ref 0–40)
Albumin/Globulin Ratio: 1.5 (ref 1.2–2.2)
Albumin: 4.1 g/dL (ref 3.7–4.7)
Alkaline Phosphatase: 64 IU/L (ref 44–121)
BUN/Creatinine Ratio: 13 (ref 12–28)
BUN: 10 mg/dL (ref 8–27)
Bilirubin Total: 0.7 mg/dL (ref 0.0–1.2)
CO2: 25 mmol/L (ref 20–29)
Calcium: 9.7 mg/dL (ref 8.7–10.3)
Chloride: 100 mmol/L (ref 96–106)
Creatinine, Ser: 0.75 mg/dL (ref 0.57–1.00)
Globulin, Total: 2.7 g/dL (ref 1.5–4.5)
Glucose: 213 mg/dL — ABNORMAL HIGH (ref 65–99)
Potassium: 4.7 mmol/L (ref 3.5–5.2)
Sodium: 139 mmol/L (ref 134–144)
Total Protein: 6.8 g/dL (ref 6.0–8.5)
eGFR: 81 mL/min/{1.73_m2} (ref >59)

## 2020-09-10 LAB — LIPID PANEL
Chol/HDL Ratio: 4.1 ratio (ref 0.0–4.4)
Cholesterol, Total: 167 mg/dL (ref 100–199)
HDL: 41 mg/dL (ref >39)
LDL Chol Calc (NIH): 105 mg/dL — ABNORMAL HIGH (ref 0–99)
Triglycerides: 113 mg/dL (ref 0–149)
VLDL Cholesterol Cal: 21 mg/dL (ref 5–40)

## 2020-09-10 LAB — T4, FREE: Free T4: 1.36 ng/dL (ref 0.82–1.77)

## 2020-09-10 LAB — TSH: TSH: 11.6 u[IU]/mL — ABNORMAL HIGH (ref 0.450–4.500)

## 2020-09-10 NOTE — Patient Instructions (Signed)
Medication Instructions:  The current medical regimen is effective;  continue present plan and medications.  *If you need a refill on your cardiac medications before your next appointment, please call your pharmacy*  Lab Work: Please have blood work today (CBC, CMP, Lipid, TSH and Free T4)  If you have labs (blood work) drawn today and your tests are completely normal, you will receive your results only by: Marland Kitchen MyChart Message (if you have MyChart) OR . A paper copy in the mail If you have any lab test that is abnormal or we need to change your treatment, we will call you to review the results.  Testing/Procedures: Your physician has requested that you have an echocardiogram. Echocardiography is a painless test that uses sound waves to create images of your heart. It provides your doctor with information about the size and shape of your heart and how well your heart's chambers and valves are working. This procedure takes approximately one hour. There are no restrictions for this procedure.  Follow-Up: At [facility], you and your health needs are our priority.  As part of our continuing mission to provide you with exceptional heart care, we have created designated Provider Care Teams.  These Care Teams include your primary Cardiologist (physician) and Advanced Practice Providers (APPs -  Physician Assistants and Nurse Practitioners) who all work together to provide you with the care you need, when you need it.  We recommend signing up for the patient portal called "MyChart".  Sign up information is provided on this After Visit Summary.  MyChart is used to connect with patients for Virtual Visits (Telemedicine).  Patients are able to view lab/test results, encounter notes, upcoming appointments, etc.  Non-urgent messages can be sent to your provider as well.   To learn more about what you can do with MyChart, go to [email or web address].    Your next appointment:   12 month(s)  The format  for your next appointment:   In Person  Provider:   Candee Furbish, MD   Thank you for choosing [facility]!!

## 2020-09-10 NOTE — Progress Notes (Signed)
Cardiology Office Note:    Date:  09/10/2020   ID:  Brittney Malone, DOB 21-Mar-1941, MRN [id number]  PCP:  [name], [place]  Cardiologist:  No primary care provider on file.  Advanced Practice Provider:  No care team member to display Electrophysiologist:  None       Referring MD: [name], MD     History of Present Illness:    Brittney Malone is a 80 y.o. female here for the follow-up of dilated cardiomyopathy, nonischemic with EF 30%.  Prior heart catheterization 04/02/2013 with no significant coronary artery disease.  Possible viral etiology.  Thankfully, EF improved  Chronically low energy has been an issue for a while.  She volunteers at [employer].  Usually takes Lasix every other day.  She could not tolerate the carvedilol 25 mg twice a day as fatigue setting.  Intermittent diarrhea.  Interestingly, her son has cardiomyopathy as well.  We had trialed Entresto in the past but it was quite expensive.  Knee arthritis has been a major complaint.  Overall been doing fairly well.  No significant shortness of breath, no chest pain.  Past Medical History:  Diagnosis Date  . Arthritis    knees- OA, uses topical treatment   . Diabetes mellitus   . History of nuclear stress test    told results were WNL, - several yrs. ago    . Hypertension   . Hypothyroidism   . Neuromuscular disorder (HCC)    neuropathy- feet- bilateral   . Osteoarthritis   . Restless leg syndrome     Past Surgical History:  Procedure Laterality Date  . ABDOMINAL HYSTERECTOMY    . APPENDECTOMY    . Carpal Tunnel Repair    . CATARACT EXTRACTION, BILATERAL    . ETHMOIDECTOMY  02/02/2012   Procedure: ETHMOIDECTOMY;  Surgeon: Ruby Cola, MD;  Location: [place];  Service: ENT;  Laterality: Left;  Left Ethmoidectomy [phone] with repair of CSF leak  . EYE SURGERY     cataracts (bilateral) - removed- /w IOL   . KNEE CARTILAGE SURGERY Left   . LEFT HEART  CATHETERIZATION WITH CORONARY ANGIOGRAM N/A 04/02/2013   Procedure: LEFT HEART CATHETERIZATION WITH CORONARY ANGIOGRAM;  Surgeon: Candee Furbish, MD;  Location[facility] CATH LAB;  Service: Cardiovascular;  Laterality: N/A;  . MAXILLARY ANTROSTOMY  02/02/2012   Procedure: MAXILLARY ANTROSTOMY;  Surgeon: Ruby Cola, MD;  Location: [place];  Service: ENT;  Laterality: Left;  Left Maxillary Antrostomy [id number]  . NASAL SINUS SURGERY  02/02/2012   Procedure: ENDOSCOPIC SINUS SURGERY WITH STEALTH;  Surgeon: Ruby Cola, MD;  Location: [place];  Service: ENT;  Laterality: Left;  Left Frontal Sinusotomy [id number], and endoscopic resection of left anterior cranial fossa tumor [id number]  . other     Hemangiopericytoma, left nasal septum,repair of CSF leak  . SPHENOIDECTOMY  02/02/2012   Procedure: SPHENOIDECTOMY;  Surgeon: Ruby Cola, MD;  Location: [place];  Service: ENT;  Laterality: Left;  Left Sphenoidectomy [phone]  . TONSILLECTOMY     as a youngster    Current Medications: Current Meds  Medication Sig  . acetaminophen (TYLENOL) 500 MG tablet Take 1,000 mg by mouth every 6 (six) hours as needed for pain.  . carvedilol (COREG) 12.5 MG tablet TAKE 1 TABLET (12.5 MG TOTAL) BY MOUTH 2 (TWO) TIMES DAILY WITH A MEAL. NEED OFFICE VISIT  . donepezil (ARICEPT) 5 MG tablet Take 5 mg by mouth at bedtime.  Marland Kitchen  gabapentin (NEURONTIN) 300 MG capsule Take one capsule AM, one capsule at lunch and 2 capsules at bedtime  . glipiZIDE (GLUCOTROL) 10 MG tablet 1 TABLET TWICE A DAY FOR DIABETES ORALLY 90 DAYS  . levothyroxine (SYNTHROID, LEVOTHROID) 125 MCG tablet Take 125 mcg by mouth daily before breakfast.   . losartan (COZAAR) 25 MG tablet TAKE 1 TABLET DAILY. PLEASE MAKE OVERDUE FOLLOW UP APPT FOR FURTHER REFILLS [phone]  . metFORMIN (GLUCOPHAGE-XR) 500 MG 24 hr tablet Take 500 mg by mouth 2 (two) times daily.  . Misc Natural Products (OSTEO BI-FLEX JOINT SHIELD PO) Take 1 tablet by mouth daily.   . Omega-3 Fatty Acids (FISH  OIL) 1200 MG CAPS Take 1,200 mg by mouth daily.   . Plant Sterols and Stanols 450 MG TABS Take 450 mg by mouth 2 (two) times daily.   . rosuvastatin (CRESTOR) 10 MG tablet Take 10 mg by mouth daily.  Marland Kitchen spironolactone (ALDACTONE) 25 MG tablet TAKE 1 TABLET BY MOUTH EVERY DAY     Allergies:   Aspirin, Augmentin [amoxicillin-pot clavulanate], Erythromycin, and Levaquin [levofloxacin in d5w]   Social History   Socioeconomic History  . Marital status: Married    Spouse name: Not on file  . Number of children: Not on file  . Years of education: Not on file  . Highest education level: Not on file  Occupational History  . Not on file  Tobacco Use  . Smoking status: Never Smoker  . Smokeless tobacco: Never Used  Vaping Use  . Vaping Use: Never used  Substance and Sexual Activity  . Alcohol use: No  . Drug use: No  . Sexual activity: Not on file  Other Topics Concern  . Not on file  Social History Narrative  . Not on file   Social Determinants of Health   Financial Resource Strain: Not on file  Food Insecurity: Not on file  Transportation Needs: Not on file  Physical Activity: Not on file  Stress: Not on file  Social Connections: Not on file     Family History: [name] family history includes Arrhythmia in her brother, mother, and sister; Diabetes in her mother and sister; Heart attack in her brother, mother, and sister; Heart disease in her mother; Heart failure in her mother and sister; Hyperlipidemia in her sister; Hypertension in her father and sister; Thyroid disease in her sister. There is no history of Stroke.  ROS:   Please see the history of present illness.     All other systems reviewed and are negative.  EKGs/Labs/Other Studies Reviewed:    The following studies were reviewed today:  Echocardiogram 2015-EF 25%, echocardiogram 2016-55%   - Left ventricle: The cavity size was normal. Systolic function was  normal. The estimated ejection fraction was in  the range of 50%  to 55%. Wall motion was normal; there were no regional wall  motion abnormalities. Doppler parameters are consistent with  abnormal left ventricular relaxation (grade 1 diastolic  dysfunction).  - Aortic valve: There was mild regurgitation.  - Left atrium: The atrium was mildly dilated.   Impressions:   - Reduced LV global longitudinal strain -12.7%   EKG:  EKG is  ordered today.  The ekg ordered today demonstrates SR 81 PVC  Recent Labs: No results found for requested labs within last 8760 hours.  Recent Lipid Panel No results found for: CHOL, TRIG, HDL, CHOLHDL, VLDL, LDLCALC, LDLDIRECT   Risk Assessment/Calculations:      Physical Exam:  VS:  BP 114/70 (BP Location: Left Arm, Patient Position: Sitting, Cuff Size: Normal)   Pulse 81   Ht 5\' 2"  (1.575 m)   Wt 185 lb (83.9 kg)   LMP  (LMP Unknown)   SpO2 96%   BMI 33.84 kg/m     Wt Readings from Last 3 Encounters:  09/10/20 185 lb (83.9 kg)  11/23/18 197 lb 12.8 oz (89.7 kg)  07/10/17 201 lb 4 oz (91.3 kg)     GEN:  Well nourished, well developed in no acute distress HEENT: Normal NECK: No JVD; No carotid bruits LYMPHATICS: No lymphadenopathy CARDIAC: RRR, 2/6 SM, no rubs, gallops RESPIRATORY:  Clear to auscultation without rales, wheezing or rhonchi  ABDOMEN: Soft, non-tender, non-distended MUSCULOSKELETAL:  No edema; No deformity  SKIN: Warm and dry NEUROLOGIC:  Alert and oriented x 3 PSYCHIATRIC:  Normal affect   ASSESSMENT:    1. Chronic systolic heart failure ([place])   2. PAT (paroxysmal atrial tachycardia) (HCC)   3. Pure hypercholesterolemia   4. Nonrheumatic aortic valve insufficiency   5. Fatigue, unspecified type   6. Medication management    PLAN:    In order of problems listed above:  Nonischemic cardiomyopathy, chronic systolic heart failure -Improved EF from the original range of 25%. -On carvedilol and losartan as well as spironolactone. -Low energy has been  a major complaint since the beginning of her diagnosis.  Thankfully given her return to normal EF, she does not require Entresto.  Cost was prohibitive at 1 point. -We will continue with her current drug regimen.  Low energy -Continue to encourage movement, activity.  Trying to continue with carvedilol at maximum tolerated dosing.  Has been a fairly longstanding complaint.  We will check blood work today.  Paroxysmal atrial tachycardia -This was also improved with carvedilol.  Previously had stopped her digoxin.    Premature ventricular contraction -Noted on ECG.  Aortic valve regurgitation -Mild in 2016.  Murmur on exam today sound systolic and may be mitral in etiology.  We will go ahead and check an echocardiogram since it has been 6 years.  Memory impairment -Currently on Aricept.  Hyperlipidemia -LDL 80 last year.  Repeating lipid profile continue with statin.  No myalgias noted.  Handicap placard has been filled out in the past.    Medication Adjustments/Labs and Tests Ordered: Current medicines are reviewed at length with the patient today.  Concerns regarding medicines are outlined above.  Orders Placed This Encounter  Procedures  . CBC  . Comprehensive metabolic panel  . Lipid panel  . TSH  . T4, free  . EKG 12-Lead  . ECHOCARDIOGRAM COMPLETE   No orders of the defined types were placed in this encounter.   Patient Instructions  Medication Instructions:  The current medical regimen is effective;  continue present plan and medications.  *If you need a refill on your cardiac medications before your next appointment, please call your pharmacy*  Lab Work: Please have blood work today (CBC, CMP, Lipid, TSH and Free T4)  If you have labs (blood work) drawn today and your tests are completely normal, you will receive your results only by: Marland Kitchen MyChart Message (if you have MyChart) OR . A paper copy in the mail If you have any lab test that is abnormal or we need to  change your treatment, we will call you to review the results.  Testing/Procedures: Your physician has requested that you have an echocardiogram. Echocardiography is a painless test that uses sound  waves to create images of your heart. It provides your doctor with information about the size and shape of your heart and how well your heart's chambers and valves are working. This procedure takes approximately one hour. There are no restrictions for this procedure.  Follow-Up: At [facility], you and your health needs are our priority.  As part of our continuing mission to provide you with exceptional heart care, we have created designated Provider Care Teams.  These Care Teams include your primary Cardiologist (physician) and Advanced Practice Providers (APPs -  Physician Assistants and Nurse Practitioners) who all work together to provide you with the care you need, when you need it.  We recommend signing up for the patient portal called "MyChart".  Sign up information is provided on this After Visit Summary.  MyChart is used to connect with patients for Virtual Visits (Telemedicine).  Patients are able to view lab/test results, encounter notes, upcoming appointments, etc.  Non-urgent messages can be sent to your provider as well.   To learn more about what you can do with MyChart, go to [email or web address].    Your next appointment:   12 month(s)  The format for your next appointment:   In Person  Provider:   Candee Furbish, MD   Thank you for choosing [facility]!!        Signed, Candee Furbish, MD  09/10/2020 10:52 AM    [place]

## 2020-09-18 DIAGNOSIS — I10 Essential (primary) hypertension: Secondary | ICD-10-CM | POA: Diagnosis not present

## 2020-09-18 DIAGNOSIS — E785 Hyperlipidemia, unspecified: Secondary | ICD-10-CM | POA: Diagnosis not present

## 2020-09-18 DIAGNOSIS — E039 Hypothyroidism, unspecified: Secondary | ICD-10-CM | POA: Diagnosis not present

## 2020-09-18 DIAGNOSIS — I5022 Chronic systolic (congestive) heart failure: Secondary | ICD-10-CM | POA: Diagnosis not present

## 2020-09-18 DIAGNOSIS — E1142 Type 2 diabetes mellitus with diabetic polyneuropathy: Secondary | ICD-10-CM | POA: Diagnosis not present

## 2020-09-18 DIAGNOSIS — M179 Osteoarthritis of knee, unspecified: Secondary | ICD-10-CM | POA: Diagnosis not present

## 2020-09-18 DIAGNOSIS — E7800 Pure hypercholesterolemia, unspecified: Secondary | ICD-10-CM | POA: Diagnosis not present

## 2020-09-24 DIAGNOSIS — R413 Other amnesia: Secondary | ICD-10-CM | POA: Diagnosis not present

## 2020-09-24 DIAGNOSIS — I5022 Chronic systolic (congestive) heart failure: Secondary | ICD-10-CM | POA: Diagnosis not present

## 2020-09-24 DIAGNOSIS — E7800 Pure hypercholesterolemia, unspecified: Secondary | ICD-10-CM | POA: Diagnosis not present

## 2020-09-24 DIAGNOSIS — R946 Abnormal results of thyroid function studies: Secondary | ICD-10-CM | POA: Diagnosis not present

## 2020-09-24 DIAGNOSIS — Z0000 Encounter for general adult medical examination without abnormal findings: Secondary | ICD-10-CM | POA: Diagnosis not present

## 2020-09-24 DIAGNOSIS — I10 Essential (primary) hypertension: Secondary | ICD-10-CM | POA: Diagnosis not present

## 2020-09-24 DIAGNOSIS — E039 Hypothyroidism, unspecified: Secondary | ICD-10-CM | POA: Diagnosis not present

## 2020-09-24 DIAGNOSIS — E1142 Type 2 diabetes mellitus with diabetic polyneuropathy: Secondary | ICD-10-CM | POA: Diagnosis not present

## 2020-10-20 ENCOUNTER — Other Ambulatory Visit (HOSPITAL_COMMUNITY): Payer: Medicare HMO

## 2020-10-29 DIAGNOSIS — E1142 Type 2 diabetes mellitus with diabetic polyneuropathy: Secondary | ICD-10-CM | POA: Diagnosis not present

## 2020-10-29 DIAGNOSIS — M179 Osteoarthritis of knee, unspecified: Secondary | ICD-10-CM | POA: Diagnosis not present

## 2020-10-29 DIAGNOSIS — E785 Hyperlipidemia, unspecified: Secondary | ICD-10-CM | POA: Diagnosis not present

## 2020-10-29 DIAGNOSIS — E7800 Pure hypercholesterolemia, unspecified: Secondary | ICD-10-CM | POA: Diagnosis not present

## 2020-10-29 DIAGNOSIS — E039 Hypothyroidism, unspecified: Secondary | ICD-10-CM | POA: Diagnosis not present

## 2020-10-29 DIAGNOSIS — I10 Essential (primary) hypertension: Secondary | ICD-10-CM | POA: Diagnosis not present

## 2020-10-29 DIAGNOSIS — I5022 Chronic systolic (congestive) heart failure: Secondary | ICD-10-CM | POA: Diagnosis not present

## 2020-11-16 ENCOUNTER — Ambulatory Visit (HOSPITAL_COMMUNITY): Payer: Medicare HMO | Attending: Cardiovascular Disease

## 2020-11-16 ENCOUNTER — Other Ambulatory Visit: Payer: Self-pay

## 2020-11-16 DIAGNOSIS — R413 Other amnesia: Secondary | ICD-10-CM | POA: Diagnosis not present

## 2020-11-16 DIAGNOSIS — I351 Nonrheumatic aortic (valve) insufficiency: Secondary | ICD-10-CM | POA: Diagnosis not present

## 2020-11-16 DIAGNOSIS — H6982 Other specified disorders of Eustachian tube, left ear: Secondary | ICD-10-CM | POA: Diagnosis not present

## 2020-11-16 DIAGNOSIS — I5022 Chronic systolic (congestive) heart failure: Secondary | ICD-10-CM

## 2020-11-16 LAB — ECHOCARDIOGRAM COMPLETE
AR max vel: 1.15 cm2
AV Area VTI: 1.16 cm2
AV Area mean vel: 1.03 cm2
AV Mean grad: 10 mmHg
AV Peak grad: 15.1 mmHg
Ao pk vel: 1.94 m/s
Area-P 1/2: 3.77 cm2
P 1/2 time: 204 msec
S' Lateral: 2.7 cm

## 2020-11-17 DIAGNOSIS — M179 Osteoarthritis of knee, unspecified: Secondary | ICD-10-CM | POA: Diagnosis not present

## 2020-11-17 DIAGNOSIS — I10 Essential (primary) hypertension: Secondary | ICD-10-CM | POA: Diagnosis not present

## 2020-11-17 DIAGNOSIS — E7800 Pure hypercholesterolemia, unspecified: Secondary | ICD-10-CM | POA: Diagnosis not present

## 2020-11-17 DIAGNOSIS — I5022 Chronic systolic (congestive) heart failure: Secondary | ICD-10-CM | POA: Diagnosis not present

## 2020-11-17 DIAGNOSIS — E039 Hypothyroidism, unspecified: Secondary | ICD-10-CM | POA: Diagnosis not present

## 2020-11-17 DIAGNOSIS — E1142 Type 2 diabetes mellitus with diabetic polyneuropathy: Secondary | ICD-10-CM | POA: Diagnosis not present

## 2020-11-17 DIAGNOSIS — E785 Hyperlipidemia, unspecified: Secondary | ICD-10-CM | POA: Diagnosis not present

## 2020-12-14 DIAGNOSIS — E119 Type 2 diabetes mellitus without complications: Secondary | ICD-10-CM | POA: Diagnosis not present

## 2020-12-14 DIAGNOSIS — Z9841 Cataract extraction status, right eye: Secondary | ICD-10-CM | POA: Diagnosis not present

## 2020-12-14 DIAGNOSIS — H52213 Irregular astigmatism, bilateral: Secondary | ICD-10-CM | POA: Diagnosis not present

## 2020-12-14 DIAGNOSIS — Z9842 Cataract extraction status, left eye: Secondary | ICD-10-CM | POA: Diagnosis not present

## 2020-12-23 DIAGNOSIS — E1142 Type 2 diabetes mellitus with diabetic polyneuropathy: Secondary | ICD-10-CM | POA: Diagnosis not present

## 2020-12-23 DIAGNOSIS — I5022 Chronic systolic (congestive) heart failure: Secondary | ICD-10-CM | POA: Diagnosis not present

## 2020-12-23 DIAGNOSIS — E039 Hypothyroidism, unspecified: Secondary | ICD-10-CM | POA: Diagnosis not present

## 2020-12-23 DIAGNOSIS — I10 Essential (primary) hypertension: Secondary | ICD-10-CM | POA: Diagnosis not present

## 2020-12-23 DIAGNOSIS — E7800 Pure hypercholesterolemia, unspecified: Secondary | ICD-10-CM | POA: Diagnosis not present

## 2020-12-23 DIAGNOSIS — M179 Osteoarthritis of knee, unspecified: Secondary | ICD-10-CM | POA: Diagnosis not present

## 2020-12-23 DIAGNOSIS — E785 Hyperlipidemia, unspecified: Secondary | ICD-10-CM | POA: Diagnosis not present

## 2021-01-28 DIAGNOSIS — E7800 Pure hypercholesterolemia, unspecified: Secondary | ICD-10-CM | POA: Diagnosis not present

## 2021-01-28 DIAGNOSIS — E1142 Type 2 diabetes mellitus with diabetic polyneuropathy: Secondary | ICD-10-CM | POA: Diagnosis not present

## 2021-01-28 DIAGNOSIS — L309 Dermatitis, unspecified: Secondary | ICD-10-CM | POA: Diagnosis not present

## 2021-01-28 DIAGNOSIS — I10 Essential (primary) hypertension: Secondary | ICD-10-CM | POA: Diagnosis not present

## 2021-01-28 DIAGNOSIS — E039 Hypothyroidism, unspecified: Secondary | ICD-10-CM | POA: Diagnosis not present

## 2021-01-28 DIAGNOSIS — R413 Other amnesia: Secondary | ICD-10-CM | POA: Diagnosis not present

## 2021-01-28 DIAGNOSIS — I429 Cardiomyopathy, unspecified: Secondary | ICD-10-CM | POA: Diagnosis not present

## 2021-02-02 DIAGNOSIS — E7800 Pure hypercholesterolemia, unspecified: Secondary | ICD-10-CM | POA: Diagnosis not present

## 2021-02-02 DIAGNOSIS — M179 Osteoarthritis of knee, unspecified: Secondary | ICD-10-CM | POA: Diagnosis not present

## 2021-02-02 DIAGNOSIS — I10 Essential (primary) hypertension: Secondary | ICD-10-CM | POA: Diagnosis not present

## 2021-02-02 DIAGNOSIS — E785 Hyperlipidemia, unspecified: Secondary | ICD-10-CM | POA: Diagnosis not present

## 2021-02-02 DIAGNOSIS — E039 Hypothyroidism, unspecified: Secondary | ICD-10-CM | POA: Diagnosis not present

## 2021-02-02 DIAGNOSIS — E1142 Type 2 diabetes mellitus with diabetic polyneuropathy: Secondary | ICD-10-CM | POA: Diagnosis not present

## 2021-02-02 DIAGNOSIS — I5022 Chronic systolic (congestive) heart failure: Secondary | ICD-10-CM | POA: Diagnosis not present

## 2021-02-10 DIAGNOSIS — E7800 Pure hypercholesterolemia, unspecified: Secondary | ICD-10-CM | POA: Diagnosis not present

## 2021-02-10 DIAGNOSIS — E1142 Type 2 diabetes mellitus with diabetic polyneuropathy: Secondary | ICD-10-CM | POA: Diagnosis not present

## 2021-02-10 DIAGNOSIS — M179 Osteoarthritis of knee, unspecified: Secondary | ICD-10-CM | POA: Diagnosis not present

## 2021-02-10 DIAGNOSIS — E785 Hyperlipidemia, unspecified: Secondary | ICD-10-CM | POA: Diagnosis not present

## 2021-02-10 DIAGNOSIS — I5022 Chronic systolic (congestive) heart failure: Secondary | ICD-10-CM | POA: Diagnosis not present

## 2021-02-10 DIAGNOSIS — I429 Cardiomyopathy, unspecified: Secondary | ICD-10-CM | POA: Diagnosis not present

## 2021-02-10 DIAGNOSIS — I10 Essential (primary) hypertension: Secondary | ICD-10-CM | POA: Diagnosis not present

## 2021-02-10 DIAGNOSIS — E039 Hypothyroidism, unspecified: Secondary | ICD-10-CM | POA: Diagnosis not present

## 2021-02-18 ENCOUNTER — Encounter: Payer: Self-pay | Admitting: Physician Assistant

## 2021-03-12 ENCOUNTER — Encounter: Payer: Self-pay | Admitting: Physician Assistant

## 2021-03-12 ENCOUNTER — Other Ambulatory Visit: Payer: Self-pay

## 2021-03-12 ENCOUNTER — Other Ambulatory Visit (INDEPENDENT_AMBULATORY_CARE_PROVIDER_SITE_OTHER): Payer: Medicare HMO

## 2021-03-12 ENCOUNTER — Ambulatory Visit: Payer: Medicare HMO | Admitting: Physician Assistant

## 2021-03-12 VITALS — BP 138/76 | HR 98 | Resp 18 | Ht 62.0 in | Wt 191.0 lb

## 2021-03-12 DIAGNOSIS — R413 Other amnesia: Secondary | ICD-10-CM

## 2021-03-12 LAB — TSH: TSH: 4.97 u[IU]/mL (ref 0.35–5.50)

## 2021-03-12 LAB — VITAMIN B12: Vitamin B-12: 113 pg/mL — ABNORMAL LOW (ref 211–911)

## 2021-03-12 NOTE — Progress Notes (Addendum)
Assessment/Plan:   Brittney Malone is a 80 y.o. year old female with risk factors including  age, hypertension, hyperlipidemia, CAD, anxiety, depression and  seen today for evaluation of memory loss.  MoCA today is 19/30, with deficiencies in memory, attention, abstraction, and delayed recall 1/5 l.  Orientation 5 out of 6.  She is already on donepezil 10 mg daily, memantine 10 mg twice daily.    Recommendations:   Mild to moderate dementia likely due to Alzheimer's disease Continue donepezil 10 mg daily.  Side effects discussed Continue Memantine 10 mg twice daily.Side effects were discussed  Follow up after neurocognitive testing MRI brain with/without contrast to assess for underlying structural abnormality and assess vascular load  Neurocognitive testing to further evaluate cognitive concerns and determine underlying cause of memory changes, including potential contribution from sleep, anxiety, or depression  Check B12, TSH Discussed safety both in and out of the home.  Discussed the importance of regular daily schedule with inclusion of crossword puzzles to maintain brain function.  Continue to monitor mood with PCP.  Stay active at least 30 minutes at least 3 times a week.  Naps should be scheduled and should be no longer than 60 minutes and should not occur after 2 PM.  Mediterranean diet is recommended  Folllow up once results above are available   Subjective:    The patient is seen in neurologic consultation at the request of [name], MD for the evaluation of memory.  The patient is accompanied by daughter in law  who supplements the history. This is a 80 y.o. year old female who has had memory issues for about 3 years, when she began to ask the same questions, tell the same stories or same conversations, losing objects, placing them anywhere around the house.  Also, she has been noted to not complete her chores as before, such as not doing the laundry, not cooking,  cleaning, and her hoarding has become more pronounced.  "She only remembers to volunteer at the church and at the thrift shop to every Tuesday and every Saturday once a month ".  She likes to watch TV.  She lives with her husband who is in good health.  Her mood is "very playful ".  She denies any depression or irritability, sleeps well without vivid dreams or sleepwalking, hallucinations or paranoia.  For the last 2 years, she has been showing less interest in bathing and dressing.  Her daughter-in-law manages the medications since August of this year, because she was not taking them, or putting them in somewhere where she cannot find them.  Her son manages the finances, because she was not keeping up with the bills.  Her appetite is good denies trouble swallowing.  She ambulates without the use of a walker or a cane, but she is more prone to falls since 2021, having bruised her tailbone, she refuses to use the devices "at all".  She denies any head injuries.  Denies any headaches, double vision, dizziness, focal numbness or tingling, unilateral weakness or tremors.  She is urine incontinent, and she "leaves deposits everywhere ".  She does not like to wear diapers unless she goes out.  She has also chronic "stool accidents ".  Denies anosmia.  Denies a history of sleep apnea, alcohol or tobacco.  No family history of dementia.     Allergies  Allergen Reactions   Aspirin Other (See Comments)    GI distress   Augmentin [Amoxicillin-Pot Clavulanate] Other (See  Comments)    unknown   Erythromycin Nausea And Vomiting   Levaquin [Levofloxacin In D5w] Diarrhea    Current Outpatient Medications  Medication Instructions   acetaminophen (TYLENOL) 1,000 mg, Every 6 hours PRN   carvedilol (COREG) 12.5 MG tablet TAKE 1 TABLET (12.5 MG TOTAL) BY MOUTH 2 (TWO) TIMES DAILY WITH A MEAL. NEED OFFICE VISIT   donepezil (ARICEPT) 5 mg, Oral, Daily at bedtime   Fish Oil 1,200 mg, Daily   gabapentin (NEURONTIN) 300 MG  capsule Take one capsule AM, one capsule at lunch and 2 capsules at bedtime   glipiZIDE (GLUCOTROL) 10 MG tablet 1 TABLET TWICE A DAY FOR DIABETES ORALLY 90 DAYS   levothyroxine (SYNTHROID) 125 mcg, Oral, Daily before breakfast   losartan (COZAAR) 25 MG tablet TAKE 1 TABLET DAILY. PLEASE MAKE OVERDUE FOLLOW UP APPT FOR FURTHER REFILLS [phone]   memantine (NAMENDA) 10 mg, Oral, 2 times daily   metFORMIN (GLUCOPHAGE-XR) 500 mg, Oral, 2 times daily   Misc Natural Products (OSTEO BI-FLEX JOINT SHIELD PO) 1 tablet, Oral, Daily   Plant Sterols and Stanols 450 mg, 2 times daily   rosuvastatin (CRESTOR) 10 mg, Oral, Daily   spironolactone (ALDACTONE) 25 MG tablet TAKE 1 TABLET BY MOUTH EVERY DAY     VITALS:   Vitals:   03/12/21 1001  BP: 138/76  Pulse: 98  Resp: 18  SpO2: 95%  Weight: 191 lb (86.6 kg)  Height: 5\' 2"  (1.575 m)   No flowsheet data found.  PHYSICAL EXAM   HEENT:  Normocephalic, atraumatic. The mucous membranes are moist. The superficial temporal arteries are without ropiness or tenderness. Cardiovascular: Regular rate and rhythm. Lungs: Clear to auscultation bilaterally. Neck: There are no carotid bruits noted bilaterally.  NEUROLOGICAL: Montreal Cognitive Assessment  03/12/2021  Visuospatial/ Executive (0/5) 4  Naming (0/3) 3  Attention: Read list of digits (0/2) 2  Attention: Read list of letters (0/1) 1  Attention: Serial 7 subtraction starting at 100 (0/3) 1  Language: Repeat phrase (0/2) 0  Language : Fluency (0/1) 0  Abstraction (0/2) 0  Delayed Recall (0/5) 1  Orientation (0/6) 5  Total 17  Adjusted Score (based on education) 18   No flowsheet data found.  No flowsheet data found.   Orientation:  Alert and oriented to person, place and time. No aphasia or dysarthria. Fund of knowledge is appropriate. Recent memory impaired and remote memory impaired  Attention and concentration are reduced  Able to name objects and repeat phrases. Delayed recall   1/5 Cranial nerves: There is good facial symmetry. Extraocular muscles are intact and visual fields are full to confrontational testing. Speech is fluent and clear. Soft palate rises symmetrically and there is no tongue deviation. Hearing is intact to conversational tone. Tone: Tone is good throughout. Sensation: Sensation is intact to light touch and pinprick throughout. Vibration is intact at the bilateral big toe.There is no extinction with double simultaneous stimulation. There is no sensory dermatomal level identified. Coordination: The patient has no difficulty with RAM's or FNF bilaterally. Normal finger to nose  Motor: Strength is 5/5 in the bilateral upper and lower extremities. There is no pronator drift. There are no fasciculations noted. DTR's: Deep tendon reflexes are 2/4 at the bilateral biceps, triceps, brachioradialis, patella and achilles.  Plantar responses are downgoing bilaterally. Gait and Station: The patient is able to ambulate without difficulty.The patient is able to heel toe walk without any difficulty.The patient is able to ambulate in a tandem fashion. The patient  is able to stand in the Romberg position.     Thank you for allowing [place] the opportunity to participate in the care of this nice patient. Please do not hesitate to contact us for any questions or concerns.   Total time spent on today's visit was  60 minutes, including both face-to-face time and nonface-to-face time.  Time included that spent on review of records (prior notes available to me/labs/imaging if pertinent), discussing treatment and goals, answering patient's questions and coordinating care.  Cc:  [name], MD  Sharene Butters 03/12/2021 4:10 PM

## 2021-03-12 NOTE — Patient Instructions (Addendum)
It was a pleasure to see you today at our office.   Recommendations:  Neurocognitive evaluation at our office MRI of the brain, the radiology office will call you to arrange you appointment Check labs today Continue donepezil 10 mg daily.  Continue Memantine 10 mg twice daily.Side effects were discussed  Follow up after neurocognitive testing  RECOMMENDATIONS FOR ALL PATIENTS WITH MEMORY PROBLEMS: 1. Continue to exercise (Recommend 30 minutes of walking everyday, or 3 hours every week) 2. Increase social interactions - continue going to [place] and enjoy social gatherings with friends and family 3. Eat healthy, avoid fried foods and eat more fruits and vegetables 4. Maintain adequate blood pressure, blood sugar, and blood cholesterol level. Reducing the risk of stroke and cardiovascular disease also helps promoting better memory. 5. Avoid stressful situations. Live a simple life and avoid aggravations. Organize your time and prepare for the next day in anticipation. 6. Sleep well, avoid any interruptions of sleep and avoid any distractions in the bedroom that may interfere with adequate sleep quality 7. Avoid sugar, avoid sweets as there is a strong link between excessive sugar intake, diabetes, and cognitive impairment We discussed the Mediterranean diet, which has been shown to help patients reduce the risk of progressive memory disorders and reduces cardiovascular risk. This includes eating fish, eat fruits and green leafy vegetables, nuts like almonds and hazelnuts, walnuts, and also use olive oil. Avoid fast foods and fried foods as much as possible. Avoid sweets and sugar as sugar use has been linked to worsening of memory function.  There is always a concern of gradual progression of memory problems. If this is the case, then we may need to adjust level of care according to patient needs. Support, both to the patient and caregiver, should then be put into place.      You have been  referred for a neuropsychological evaluation (i.e., evaluation of memory and thinking abilities). Please bring someone with you to this appointment if possible, as it is helpful for the doctor to hear from both you and another adult who knows you well. Please bring eyeglasses and hearing aids if you wear them.    The evaluation will take approximately 3 hours and has two parts:   The first part is a clinical interview with the neuropsychologist (Dr. Melvyn Novas or Dr. Nicole Kindred). During the interview, the neuropsychologist will speak with you and the individual you brought to the appointment.    The second part of the evaluation is testing with the doctor's technician Hinton Dyer or Maudie Mercury). During the testing, the technician will ask you to remember different types of material, solve problems, and answer some questionnaires. Your family member will not be present for this portion of the evaluation.   Please note: We must reserve several hours of the neuropsychologist's time and the psychometrician's time for your evaluation appointment. As such, there is a No-Show fee of $100. If you are unable to attend any of your appointments, please contact our office as soon as possible to reschedule.    FALL PRECAUTIONS: Be cautious when walking. Scan the area for obstacles that may increase the risk of trips and falls. When getting up in the mornings, sit up at the edge of the bed for a few minutes before getting out of bed. Consider elevating the bed at the head end to avoid drop of blood pressure when getting up. Walk always in a well-lit room (use night lights in the walls). Avoid area rugs or power cords from  appliances in the middle of the walkways. Use a walker or a cane if necessary and consider physical therapy for balance exercise. Get your eyesight checked regularly.  FINANCIAL OVERSIGHT: Supervision, especially oversight when making financial decisions or transactions is also recommended.  HOME SAFETY: Consider the  safety of the kitchen when operating appliances like stoves, microwave oven, and blender. Consider having supervision and share cooking responsibilities until no longer able to participate in those. Accidents with firearms and other hazards in the house should be identified and addressed as well.   ABILITY TO BE LEFT ALONE: If patient is unable to contact 911 operator, consider using LifeLine, or when the need is there, arrange for someone to stay with patients. Smoking is a fire hazard, consider supervision or cessation. Risk of wandering should be assessed by caregiver and if detected at any point, supervision and safe proof recommendations should be instituted.  MEDICATION SUPERVISION: Inability to self-administer medication needs to be constantly addressed. Implement a mechanism to ensure safe administration of the medications.    Mediterranean Diet A Mediterranean diet refers to food and lifestyle choices that are based on the traditions of countries located on the [employer]. This way of eating has been shown to help prevent certain conditions and improve outcomes for people who have chronic diseases, like kidney disease and heart disease. What are tips for following this plan? Lifestyle  Cook and eat meals together with your family, when possible. Drink enough fluid to keep your urine clear or pale yellow. Be physically active every day. This includes: Aerobic exercise like running or swimming. Leisure activities like gardening, walking, or housework. Get 7-8 hours of sleep each night. If recommended by your health care provider, drink red wine in moderation. This means 1 glass a day for nonpregnant women and 2 glasses a day for men. A glass of wine equals 5 oz (150 mL). Reading food labels  Check the serving size of packaged foods. For foods such as rice and pasta, the serving size refers to the amount of cooked product, not dry. Check the total fat in packaged foods. Avoid foods  that have saturated fat or trans fats. Check the ingredients list for added sugars, such as corn syrup. Shopping  At the grocery store, buy most of your food from the areas near the walls of the store. This includes: Fresh fruits and vegetables (produce). Grains, beans, nuts, and seeds. Some of these may be available in unpackaged forms or large amounts (in bulk). Fresh seafood. Poultry and eggs. Low-fat dairy products. Buy whole ingredients instead of prepackaged foods. Buy fresh fruits and vegetables in-season from local farmers markets. Buy frozen fruits and vegetables in resealable bags. If you do not have access to quality fresh seafood, buy precooked frozen shrimp or canned fish, such as tuna, salmon, or sardines. Buy small amounts of raw or cooked vegetables, salads, or olives from the deli or salad bar at your store. Stock your pantry so you always have certain foods on hand, such as olive oil, canned tuna, canned tomatoes, rice, pasta, and beans. Cooking  Cook foods with extra-virgin olive oil instead of using butter or other vegetable oils. Have meat as a side dish, and have vegetables or grains as your main dish. This means having meat in small portions or adding small amounts of meat to foods like pasta or stew. Use beans or vegetables instead of meat in common dishes like chili or lasagna. Experiment with different cooking methods. Try roasting or broiling  vegetables instead of steaming or sauteing them. Add frozen vegetables to soups, stews, pasta, or rice. Add nuts or seeds for added healthy fat at each meal. You can add these to yogurt, salads, or vegetable dishes. Marinate fish or vegetables using olive oil, lemon juice, garlic, and fresh herbs. Meal planning  Plan to eat 1 vegetarian meal one day each week. Try to work up to 2 vegetarian meals, if possible. Eat seafood 2 or more times a week. Have healthy snacks readily available, such as: Vegetable sticks with  hummus. Greek yogurt. Fruit and nut trail mix. Eat balanced meals throughout the week. This includes: Fruit: 2-3 servings a day Vegetables: 4-5 servings a day Low-fat dairy: 2 servings a day Fish, poultry, or lean meat: 1 serving a day Beans and legumes: 2 or more servings a week Nuts and seeds: 1-2 servings a day Whole grains: 6-8 servings a day Extra-virgin olive oil: 3-4 servings a day Limit red meat and sweets to only a few servings a month What are my food choices? Mediterranean diet Recommended Grains: Whole-grain pasta. Brown rice. Bulgar wheat. Polenta. Couscous. Whole-wheat bread. [place]. Vegetables: Artichokes. Beets. Broccoli. Cabbage. Carrots. Eggplant. Green beans. Chard. Kale. Spinach. Onions. Leeks. Peas. Squash. Tomatoes. Peppers. Radishes. Fruits: Apples. Apricots. Avocado. Berries. Bananas. Cherries. Dates. Figs. Grapes. Lemons. Melon. Oranges. Peaches. Plums. Pomegranate. Meats and other protein foods: Beans. Almonds. Sunflower seeds. Pine nuts. Peanuts. [place]. Salmon. Scallops. Shrimp. [place]. Tilapia. Clams. Oysters. Eggs. Dairy: Low-fat milk. Cheese. Greek yogurt. Beverages: Water. Red wine. Herbal tea. Fats and oils: Extra virgin olive oil. Avocado oil. Grape seed oil. Sweets and desserts: [place] yogurt with honey. Baked apples. Poached pears. Trail mix. Seasoning and other foods: Basil. Cilantro. Coriander. Cumin. Mint. Parsley. Sage. Rosemary. Tarragon. Garlic. Oregano. Thyme. Pepper. Balsalmic vinegar. Tahini. Hummus. Tomato sauce. Olives. Mushrooms. Limit these Grains: Prepackaged pasta or rice dishes. Prepackaged cereal with added sugar. Vegetables: Deep fried potatoes (french fries). Fruits: Fruit canned in syrup. Meats and other protein foods: Beef. Pork. Lamb. Poultry with skin. Hot dogs. Berniece Salines. Dairy: Ice cream. Sour cream. Whole milk. Beverages: Juice. Sugar-sweetened soft drinks. Beer. Liquor and spirits. Fats and oils: Butter. Canola oil.  Vegetable oil. Beef fat (tallow). Lard. Sweets and desserts: Cookies. Cakes. Pies. Candy. Seasoning and other foods: Mayonnaise. Premade sauces and marinades. The items listed may not be a complete list. Talk with your dietitian about what dietary choices are right for you. Summary The Mediterranean diet includes both food and lifestyle choices. Eat a variety of fresh fruits and vegetables, beans, nuts, seeds, and whole grains. Limit the amount of red meat and sweets that you eat. Talk with your health care provider about whether it is safe for you to drink red wine in moderation. This means 1 glass a day for nonpregnant women and 2 glasses a day for men. A glass of wine equals 5 oz (150 mL). This information is not intended to replace advice given to you by your health care provider. Make sure you discuss any questions you have with your health care provider. Document Released: 01/14/2016 Document Revised: 02/16/2016 Document Reviewed: 01/14/2016 Elsevier Interactive Patient Education  2017 [employer].     We have sent a referral to [facility] for your MRI and they will call you directly to schedule your appointment. They are located at [place]. If you need to contact them directly please call [phone].   Your provider has requested that you have labwork completed today. Please go to  [facility] ([id number]) on the second floor of this building before leaving the office today. You do not need to check in. If you are not called within 15 minutes please check with the front desk.

## 2021-03-16 ENCOUNTER — Telehealth: Payer: Self-pay | Admitting: Physician Assistant

## 2021-03-16 NOTE — Telephone Encounter (Signed)
Pt daughter Claiborne Billings, said she is returning a call to christy, she has misses a few calls from her. It was regarding blood work

## 2021-03-16 NOTE — Progress Notes (Signed)
Left message at 03/16/2021 1014

## 2021-03-18 DIAGNOSIS — E1142 Type 2 diabetes mellitus with diabetic polyneuropathy: Secondary | ICD-10-CM | POA: Diagnosis not present

## 2021-03-18 DIAGNOSIS — E7800 Pure hypercholesterolemia, unspecified: Secondary | ICD-10-CM | POA: Diagnosis not present

## 2021-03-18 DIAGNOSIS — E785 Hyperlipidemia, unspecified: Secondary | ICD-10-CM | POA: Diagnosis not present

## 2021-03-18 DIAGNOSIS — M179 Osteoarthritis of knee, unspecified: Secondary | ICD-10-CM | POA: Diagnosis not present

## 2021-03-18 DIAGNOSIS — E039 Hypothyroidism, unspecified: Secondary | ICD-10-CM | POA: Diagnosis not present

## 2021-03-18 DIAGNOSIS — I5022 Chronic systolic (congestive) heart failure: Secondary | ICD-10-CM | POA: Diagnosis not present

## 2021-03-18 DIAGNOSIS — I10 Essential (primary) hypertension: Secondary | ICD-10-CM | POA: Diagnosis not present

## 2021-03-31 DIAGNOSIS — R413 Other amnesia: Secondary | ICD-10-CM | POA: Diagnosis not present

## 2021-03-31 DIAGNOSIS — E7800 Pure hypercholesterolemia, unspecified: Secondary | ICD-10-CM | POA: Diagnosis not present

## 2021-03-31 DIAGNOSIS — I10 Essential (primary) hypertension: Secondary | ICD-10-CM | POA: Diagnosis not present

## 2021-03-31 DIAGNOSIS — Z23 Encounter for immunization: Secondary | ICD-10-CM | POA: Diagnosis not present

## 2021-03-31 DIAGNOSIS — E1142 Type 2 diabetes mellitus with diabetic polyneuropathy: Secondary | ICD-10-CM | POA: Diagnosis not present

## 2021-03-31 DIAGNOSIS — E039 Hypothyroidism, unspecified: Secondary | ICD-10-CM | POA: Diagnosis not present

## 2021-04-19 DIAGNOSIS — M179 Osteoarthritis of knee, unspecified: Secondary | ICD-10-CM | POA: Diagnosis not present

## 2021-04-19 DIAGNOSIS — E785 Hyperlipidemia, unspecified: Secondary | ICD-10-CM | POA: Diagnosis not present

## 2021-04-19 DIAGNOSIS — I10 Essential (primary) hypertension: Secondary | ICD-10-CM | POA: Diagnosis not present

## 2021-04-19 DIAGNOSIS — E039 Hypothyroidism, unspecified: Secondary | ICD-10-CM | POA: Diagnosis not present

## 2021-04-19 DIAGNOSIS — E7800 Pure hypercholesterolemia, unspecified: Secondary | ICD-10-CM | POA: Diagnosis not present

## 2021-04-19 DIAGNOSIS — E1142 Type 2 diabetes mellitus with diabetic polyneuropathy: Secondary | ICD-10-CM | POA: Diagnosis not present

## 2021-05-10 ENCOUNTER — Ambulatory Visit
Admission: RE | Admit: 2021-05-10 | Discharge: 2021-05-10 | Disposition: A | Discharge status: Home / Self Care | Payer: Medicare HMO | Source: Ambulatory Visit | Attending: Physician Assistant | Admitting: Physician Assistant

## 2021-05-10 DIAGNOSIS — R531 Weakness: Secondary | ICD-10-CM | POA: Diagnosis not present

## 2021-05-10 DIAGNOSIS — R413 Other amnesia: Secondary | ICD-10-CM

## 2021-05-10 DIAGNOSIS — R262 Difficulty in walking, not elsewhere classified: Secondary | ICD-10-CM | POA: Diagnosis not present

## 2021-05-10 DIAGNOSIS — R200 Anesthesia of skin: Secondary | ICD-10-CM | POA: Diagnosis not present

## 2021-05-18 DIAGNOSIS — E1142 Type 2 diabetes mellitus with diabetic polyneuropathy: Secondary | ICD-10-CM | POA: Diagnosis not present

## 2021-05-18 DIAGNOSIS — I10 Essential (primary) hypertension: Secondary | ICD-10-CM | POA: Diagnosis not present

## 2021-05-18 DIAGNOSIS — E785 Hyperlipidemia, unspecified: Secondary | ICD-10-CM | POA: Diagnosis not present

## 2021-05-18 DIAGNOSIS — M179 Osteoarthritis of knee, unspecified: Secondary | ICD-10-CM | POA: Diagnosis not present

## 2021-05-18 DIAGNOSIS — E039 Hypothyroidism, unspecified: Secondary | ICD-10-CM | POA: Diagnosis not present

## 2021-05-18 DIAGNOSIS — I5022 Chronic systolic (congestive) heart failure: Secondary | ICD-10-CM | POA: Diagnosis not present

## 2021-05-18 DIAGNOSIS — E7800 Pure hypercholesterolemia, unspecified: Secondary | ICD-10-CM | POA: Diagnosis not present

## 2021-06-15 DIAGNOSIS — E785 Hyperlipidemia, unspecified: Secondary | ICD-10-CM | POA: Diagnosis not present

## 2021-06-15 DIAGNOSIS — I10 Essential (primary) hypertension: Secondary | ICD-10-CM | POA: Diagnosis not present

## 2021-06-15 DIAGNOSIS — E7800 Pure hypercholesterolemia, unspecified: Secondary | ICD-10-CM | POA: Diagnosis not present

## 2021-06-15 DIAGNOSIS — E1142 Type 2 diabetes mellitus with diabetic polyneuropathy: Secondary | ICD-10-CM | POA: Diagnosis not present

## 2021-06-15 DIAGNOSIS — E039 Hypothyroidism, unspecified: Secondary | ICD-10-CM | POA: Diagnosis not present

## 2021-07-19 DIAGNOSIS — E7800 Pure hypercholesterolemia, unspecified: Secondary | ICD-10-CM | POA: Diagnosis not present

## 2021-07-19 DIAGNOSIS — I10 Essential (primary) hypertension: Secondary | ICD-10-CM | POA: Diagnosis not present

## 2021-07-19 DIAGNOSIS — E1142 Type 2 diabetes mellitus with diabetic polyneuropathy: Secondary | ICD-10-CM | POA: Diagnosis not present

## 2021-08-16 DIAGNOSIS — I10 Essential (primary) hypertension: Secondary | ICD-10-CM | POA: Diagnosis not present

## 2021-08-16 DIAGNOSIS — E039 Hypothyroidism, unspecified: Secondary | ICD-10-CM | POA: Diagnosis not present

## 2021-08-16 DIAGNOSIS — E1142 Type 2 diabetes mellitus with diabetic polyneuropathy: Secondary | ICD-10-CM | POA: Diagnosis not present

## 2021-08-16 DIAGNOSIS — E7800 Pure hypercholesterolemia, unspecified: Secondary | ICD-10-CM | POA: Diagnosis not present

## 2021-08-24 ENCOUNTER — Encounter: Payer: Self-pay | Admitting: Psychology

## 2021-08-24 DIAGNOSIS — E1142 Type 2 diabetes mellitus with diabetic polyneuropathy: Secondary | ICD-10-CM | POA: Insufficient documentation

## 2021-08-24 DIAGNOSIS — E7800 Pure hypercholesterolemia, unspecified: Secondary | ICD-10-CM | POA: Insufficient documentation

## 2021-08-24 DIAGNOSIS — M179 Osteoarthritis of knee, unspecified: Secondary | ICD-10-CM | POA: Insufficient documentation

## 2021-08-24 DIAGNOSIS — I10 Essential (primary) hypertension: Secondary | ICD-10-CM | POA: Insufficient documentation

## 2021-08-24 DIAGNOSIS — G2581 Restless legs syndrome: Secondary | ICD-10-CM | POA: Insufficient documentation

## 2021-08-24 NOTE — Progress Notes (Signed)
? ?NEUROPSYCHOLOGICAL EVALUATION ?[place] ?[place] Department of Neurology ? ?Date of Evaluation: August 25, 2021 ? ?Reason for Referral:  ? ?Brittney Malone is a 81 y.o. right-handed Caucasian female referred by  Sharene Butters, PA-C , to characterize her current cognitive functioning and assist with diagnostic clarity and treatment planning in the context of subjective cognitive decline and concern for the presence of a neurodegenerative illness.  ? ?Assessment and Plan:  ? ?Clinical Impression(s): ?Brittney Malone's pattern of performance is suggestive of severe impairment surrounding encoding (i.e., learning) and delayed retrieval aspects of verbal and visual memory. Further weaknesses were exhibited across complex attention, executive functioning, and verbal fluency. Performances were largely appropriate across processing speed, basic attention, safety/judgment, receptive language, confrontation naming, and the majority of visuospatial abilities. While Brittney Malone denied all cognitive and functional difficulties, I do not believe that she has fully intact insight into her current limitations and the reporting of her daughter and other family members regarding functional decline seems a more accurate assessment. Given cognitive and functional decline, Brittney Malone would best meet criteria for a Major Neurocognitive Disorder ("dementia"). However, she is likely towards the milder end of this spectrum presently.  ? ?The etiology of ongoing dysfunction is unclear at the present time and further work-up and monitoring will be vital moving forward. There could be a parkinsonian component to her current presentation given her gait (i.e., stooped, significant forward hunch with slow, small steps) and report of several falls within the past few months. While she does not appear to exhibit an overt tremor, this is not required to be present in this family of illnesses. Current testing would not be  inconsistent with many of these conditions. However, Lewy body dementia does seem less likely.  ? ?There could also be a vascular contribution to ongoing dysfunction given her medical history (i.e., atrial tachycardia, systolic heart failure, cardiomyopathy, hypertension, hyperlipidemia, type II diabetes). Her pattern across testing is fairly consistent with this presentation. While her prior neuroimaging was previously read to suggest mild microvascular ischemic changes, it is my opinion that this somewhat minimizes the extent of changes seen on her scan. ? ?It is also worth highlighting that she does exhibit the common triad of symptoms consistent with normal pressure hydrocephalus (i.e., urinary incontinence, gait/balance disturbances, and ongoing cognitive decline). Her pattern of deficits across testing is not inconsistent with individuals who have this neurological condition. However, anatomically, per the radiologist who read her December 2022 brain MRI, no ventriculomegaly was described, which would be atypical of this condition. There also remains the potential that gait disturbances are influenced by diabetic neuropathy. ? ?I cannot rule out a very early presentation of Alzheimer's disease. Across memory testing, Brittney Malone did not benefit from repeated exposure to information across learning trials and was fully amnestic across all memory tasks after a brief delay. However, she was able to demonstrate some memory storage across recognition trials which does not suggest a profound memory storage impairment. She also performed well across confrontation naming and visuospatial abilities. While weakness was noted across semantic fluency, it appeared mildly improved relative to phonemic fluency tasks. All of these are inconsistent with typically presenting Alzheimer's disease. ? ?While her brain MRI may show some advanced frontal atrophy, she and her daughter did not describe significant personality changes  or functional language disruption, making frontotemporal dementia less likely. Continued medical monitoring will be very important moving forward.  ? ?Recommendations: ?A repeat neuropsychological evaluation in 18 months (or sooner  if functional decline is noted) is recommended to assess the trajectory of future cognitive decline should it occur. This will also aid in future efforts towards improved diagnostic clarity. ? ?I would recommend that she undergo a thorough movement/gait evaluation with her neurologist to better understand the presence of various parkinsonian symptoms outside of overt tremor.  ? ?Brittney Malone has already been prescribed a medication aimed to address memory loss and concerns surrounding Alzheimer's disease (i.e., donepezil/Aricept). While I cannot rule out an early presentation currently, this does remain a possibility in very early stages. She is encouraged to continue taking this medication as prescribed assuming there have been no serious side effects. It is important to highlight that this medication has been shown to slow functional decline in some individuals. There is no current treatment which can stop or reverse cognitive decline when caused by a neurodegenerative illness.  ? ?Should there be further progression of current deficits over time, she is unlikely to regain any independent living skills lost. Therefore, it is recommended that she remain as involved as possible in all aspects of daily living with supervision to ensure adequate performance. She will likely benefit from the establishment and maintenance of a routine in order to maximize her functional abilities over time. ? ?It will be important for Brittney Malone to have another person with her when in situations where she may need to process information, weigh the pros and cons of different options, and make decisions, in order to ensure that she fully understands and recalls all information to be considered. ? ?Performance  across neurocognitive testing is not a strong predictor of an individual's safety operating a motor vehicle. Should her family wish to pursue a formalized driving evaluation, they could reach out to the following agencies: ?[facility] [employer] [facility] in [place]: [phone] ?[facility]: [phone] ?[facility]: [phone] ?[facility]: [phone] or [phone] ? ?Ms. Killmer is encouraged to attend to lifestyle factors for brain health (e.g., regular physical exercise, good nutrition habits, regular participation in cognitively-stimulating activities, and general stress management techniques), which are likely to have benefits for both emotional adjustment and cognition. Optimal control of vascular risk factors (including safe cardiovascular exercise and adherence to dietary recommendations) is encouraged. Continued participation in activities which provide mental stimulation and social interaction is also recommended.  ? ?Important information should be provided to Ms. Morrish in written format in all instances. This information should be placed in a highly frequented and easily visible location within her home to promote recall. External strategies such as written notes in a consistently used memory journal, visual and nonverbal auditory cues such as a calendar on the refrigerator or appointments with alarm, such as on a cell phone, can also help maximize recall. ? ?To address problems with fluctuating attention and executive dysfunction, she may wish to consider: ?  -Avoiding external distractions when needing to concentrate ?  -Limiting exposure to fast paced environments with multiple sensory demands ?  -Writing down complicated information and using checklists ?  -Attempting and completing one task at a time (i.e., no multi-tasking) ?  -Verbalizing aloud each step of a task to maintain focus ?  -Reducing the amount of information considered at one time ? ?Review of  Records:  ? ?Ms. Spadoni was seen by [facility]  Sharene Butters, PA-C) on 03/12/2021 for an evaluation of memory loss. At that time, memory concerns were said to be ongoing for the past three years. Ex

## 2021-08-25 ENCOUNTER — Ambulatory Visit (INDEPENDENT_AMBULATORY_CARE_PROVIDER_SITE_OTHER): Payer: Medicare HMO | Admitting: Psychology

## 2021-08-25 ENCOUNTER — Ambulatory Visit: Payer: Medicare HMO

## 2021-08-25 ENCOUNTER — Other Ambulatory Visit: Payer: Self-pay

## 2021-08-25 ENCOUNTER — Encounter: Payer: Self-pay | Admitting: Psychology

## 2021-08-25 DIAGNOSIS — F03A0 Unspecified dementia, mild, without behavioral disturbance, psychotic disturbance, mood disturbance, and anxiety: Secondary | ICD-10-CM | POA: Diagnosis not present

## 2021-08-25 DIAGNOSIS — I6781 Acute cerebrovascular insufficiency: Secondary | ICD-10-CM

## 2021-08-25 DIAGNOSIS — R4189 Other symptoms and signs involving cognitive functions and awareness: Secondary | ICD-10-CM

## 2021-08-25 DIAGNOSIS — R32 Unspecified urinary incontinence: Secondary | ICD-10-CM | POA: Insufficient documentation

## 2021-08-25 DIAGNOSIS — R69 Illness, unspecified: Secondary | ICD-10-CM | POA: Diagnosis not present

## 2021-08-25 HISTORY — DX: Unspecified dementia, mild, without behavioral disturbance, psychotic disturbance, mood disturbance, and anxiety: F03.A0

## 2021-08-25 NOTE — Progress Notes (Signed)
? ?  Psychometrician Note ?  ?Cognitive testing was administered to Linna Caprice by Cruzita Lederer, B.S. (psychometrist) under the supervision of Dr. Christia Reading, Ph.D., licensed psychologist on 08/25/2021. Ms. Mcleish did not appear overtly distressed by the testing session per behavioral observation or responses across self-report questionnaires. Rest breaks were offered.  ?  ?The battery of tests administered was selected by Dr. Christia Reading, Ph.D. with consideration to Ms. Novakowski's current level of functioning, the nature of her symptoms, emotional and behavioral responses during interview, level of literacy, observed level of motivation/effort, and the nature of the referral question. This battery was communicated to the psychometrist. Communication between Dr. Christia Reading, Ph.D. and the psychometrist was ongoing throughout the evaluation and Dr. Christia Reading, Ph.D. was immediately accessible at all times. Dr. Christia Reading, Ph.D. provided supervision to the psychometrist on the date of this service to the extent necessary to assure the quality of all services provided.  ?  ?RUTH KOVICH will return within approximately 1-2 weeks for an interactive feedback session with Dr. Melvyn Novas at which time her test performances, clinical impressions, and treatment recommendations will be reviewed in detail. Ms. Maxfield understands she can contact our office should she require our assistance before this time. ? ?A total of 115 minutes of billable time were spent face-to-face with Ms. Koloski by the psychometrist. This includes both test administration and scoring time. Billing for these services is reflected in the clinical report generated by Dr. Christia Reading, Ph.D. ? ?This note reflects time spent with the psychometrician and does not include test scores or any clinical interpretations made by Dr. Melvyn Novas. The full report will follow in a separate note.  ?

## 2021-08-26 ENCOUNTER — Encounter: Payer: Self-pay | Admitting: Psychology

## 2021-09-01 ENCOUNTER — Encounter: Payer: Medicare HMO | Admitting: Psychology

## 2021-09-02 ENCOUNTER — Ambulatory Visit (INDEPENDENT_AMBULATORY_CARE_PROVIDER_SITE_OTHER): Payer: Medicare HMO | Admitting: Psychology

## 2021-09-02 ENCOUNTER — Encounter: Payer: Medicare HMO | Admitting: Psychology

## 2021-09-02 DIAGNOSIS — F03A0 Unspecified dementia, mild, without behavioral disturbance, psychotic disturbance, mood disturbance, and anxiety: Secondary | ICD-10-CM

## 2021-09-02 NOTE — Progress Notes (Signed)
? ?  Neuropsychology Feedback Session ?[place] ?[place] Department of Neurology ? ?  ? ?A member of Ms. Lacks's family called on the morning of her feedback appointment stating that she was ill and unable to make said appointment. She was rescheduled to 09/07/21 at 2:30pm. ? ?  ? ?

## 2021-09-07 ENCOUNTER — Ambulatory Visit (INDEPENDENT_AMBULATORY_CARE_PROVIDER_SITE_OTHER): Payer: Medicare HMO | Admitting: Psychology

## 2021-09-07 DIAGNOSIS — F03A0 Unspecified dementia, mild, without behavioral disturbance, psychotic disturbance, mood disturbance, and anxiety: Secondary | ICD-10-CM

## 2021-09-07 DIAGNOSIS — R69 Illness, unspecified: Secondary | ICD-10-CM | POA: Diagnosis not present

## 2021-09-07 NOTE — Progress Notes (Signed)
? ?  Neuropsychology Feedback Session ?[facility] ?[place] Department of Neurology ? ?Reason for Referral:  ? ?Brittney Malone is a 81 y.o. right-handed Caucasian female referred by  Sharene Butters, PA-C , to characterize her current cognitive functioning and assist with diagnostic clarity and treatment planning in the context of subjective cognitive decline and concern for the presence of a neurodegenerative illness.  ? ?Feedback:  ? ?Brittney Malone completed a comprehensive neuropsychological evaluation on 08/25/2021. Please refer to that encounter for the full report and recommendations. Briefly, results suggested severe impairment surrounding encoding (i.e., learning) and delayed retrieval aspects of verbal and visual memory. Further weaknesses were exhibited across complex attention, executive functioning, and verbal fluency. The etiology of ongoing dysfunction is unclear at the present time and further work-up and monitoring will be vital moving forward. There could also be a vascular contribution to ongoing dysfunction given her medical history (i.e., atrial tachycardia, systolic heart failure, cardiomyopathy, hypertension, hyperlipidemia, type II diabetes) and neuroimaging suggesting microvascular ischemic changes, it is my opinion that this somewhat minimizes the extent of changes seen on her scan. She does exhibit the common triad of symptoms consistent with normal pressure hydrocephalus (i.e., urinary incontinence, gait/balance disturbances, and ongoing cognitive decline). Her pattern of deficits across testing is not inconsistent with individuals who have this neurological condition. However, anatomically, per the radiologist who read her December 2022 brain MRI, no ventriculomegaly was described, which would be atypical of this condition. I cannot rule out a very early presentation of Alzheimer's disease. Across memory testing, Brittney Malone did not benefit from repeated exposure to  information across learning trials and was fully amnestic across all memory tasks after a brief delay. However, she was able to demonstrate some memory storage across recognition trials which does not suggest a profound memory storage impairment. She also performed well across confrontation naming and visuospatial abilities. While weakness was noted across semantic fluency, it appeared mildly improved relative to phonemic fluency tasks. All of these are inconsistent with typically presenting Alzheimer's disease. ? ?Brittney Malone was accompanied by her daughter and son during the current telephone call. They were within their residences while I was within my office. I discussed the limitations of evaluation and management by telemedicine and the availability of in person appointments. Brittney Malone expressed her understanding and agreed to proceed. Content of the current session focused on the results of her neuropsychological evaluation. Brittney Malone and her family were given the opportunity to ask questions and their questions were answered. They were encouraged to reach out should additional questions arise. Her report is available to her on MyChart.  ? ?  ? ?23 minutes were spent conducting the current feedback session with Brittney Malone, billed as one unit [phone].  ?

## 2021-09-12 NOTE — Progress Notes (Signed)
? ?Assessment/Plan:  ? ? ?Mild vascular dementia without behavioral disturbance ?  ?Recommendations:  ? ?Discussed safety both in and out of the home.  ?Referral to physical therapy for strength and balance ?Referral to home health nursing for safety ?Resume donepezil 10 mg daily by PCP, side effects discussed.  The patient does not need to be on 2 antidementia medications at this time, as her dementia is mild.  She will do so by her PCP per patient and family choice. ?Discussed the importance of regular daily schedule with inclusion of crossword puzzles to maintain brain function.  ?Continue to monitor mood by PCP ?Stay active at least 30 minutes at least 3 times a week.  ?Naps should be scheduled and should be no longer than 60 minutes and should not occur after 2 PM.  ?Mediterranean diet is recommended  ?Monitor driving ?Follow up in  3 months. ? ? ?Case discussed with Dr. Delice Lesch who agrees with the plan ?  ? ?Subjective:  ? ? ?Brittney Malone is a very pleasant 81 y.o. RH female hypertension, hyperlipidemia, CAD, anxiety, depression seen today in follow up for memory loss.  She is accompanied by her daughter, and on the phone are 3 different family members, including her husband, and her other children.  She carries a history of mild dementia without behavioral disturbance.  Previous records as well as any outside records available were reviewed prior to todays visit.  Patient was last seen at our office on 03/17/2021, at which time her MoCA was 19/30.  She ws on donepezil 10 mg daily, and on memantine 10 mg twice daily but has not been compliant with the medication missing several doses.  Recent neurocognitive evaluation by Dr. Melvyn Novas exhibited the common triad of symptoms consistent with normal pressure hydrocephalus (i.e., urinary incontinence, gait/balance disturbances, and ongoing cognitive decline). Her pattern of deficits across testing is not inconsistent with individuals who have this neurological  condition. However, anatomically, per the radiologist who read her December 2022 brain MRI, no ventriculomegaly was described, which would be atypical of this condition.  Since her last visit, her memory is about the same according to her.  She continues to repeat herself, losing objects, placing them anywhere around the house.  She continues to have some hoarding behavior.  She enjoys watching TV during the day.  She also likes to go to church.  She lives with her husband who is in good health.  Her mood is good, denies any depression or irritability.  She sleeps well, without vivid dreams or sleepwalking, hallucinations or paranoia.  There are some hygiene concerns, having less interest in bathing and dressing.  Her daughter-in-law manages the medications, and her son manages the finances.  Her appetite is good, denies trouble swallowing.  She denies any recent falls or head injuries.  No headaches, double vision, dizziness, focal numbness or tingling, unilateral weakness or tremors.  She has known gait instability.  She is urine incontinent, but does not like to wear diapers and if she goes out.  She also has what is called "chronic stool accidents ".  She denies anosmia. ? ? ?   Initial visit 19.12.22 The patient is seen in neurologic consultation at the request of [name], MD for the evaluation of memory.  The patient is accompanied by daughter in law  who supplements the history. ?This is a 81 y.o. year old female who has had memory issues for about 3 years, when she began to ask the same  questions, tell the same stories or same conversations, losing objects, placing them anywhere around the house.  Also, she has been noted to not complete her chores as before, such as not doing the laundry, not cooking, cleaning, and her hoarding has become more pronounced.  "She only remembers to volunteer at the church and at the thrift shop to every Tuesday and every Saturday once a month ".  She likes to watch TV.   She lives with her husband who is in good health.  Her mood is "very playful ".  She denies any depression or irritability, sleeps well without vivid dreams or sleepwalking, hallucinations or paranoia.  For the last 2 years, she has been showing less interest in bathing and dressing.  Her daughter-in-law manages the medications since August of this year, because she was not taking them, or putting them in somewhere where she cannot find them.  Her son manages the finances, because she was not keeping up with the bills.  Her appetite is good denies trouble swallowing.  She ambulates without the use of a walker or a cane, but she is more prone to falls since 2021, having bruised her tailbone, she refuses to use the devices "at all".  She denies any head injuries.  Denies any headaches, double vision, dizziness, focal numbness or tingling, unilateral weakness or tremors.  She is urine incontinent, and she "leaves deposits everywhere ".  She does not like to wear diapers unless she goes out.  She has also chronic "stool accidents ".  Denies anosmia.  Denies a history of sleep apnea, alcohol or tobacco.  No family history of dementia.   ? ?  Neuropsych testing 08/2021 Dr. Melvyn Novas Briefly, results suggested severe impairment surrounding encoding (i.e., learning) and delayed retrieval aspects of verbal and visual memory. Further weaknesses were exhibited across complex attention, executive functioning, and verbal fluency. The etiology of ongoing dysfunction is unclear at the present time and further work-up and monitoring will be vital moving forward. There could also be a vascular contribution to ongoing dysfunction given her medical history (i.e., atrial tachycardia, systolic heart failure, cardiomyopathy, hypertension, hyperlipidemia, type II diabetes) and neuroimaging suggesting microvascular ischemic changes, it is my opinion that this somewhat minimizes the extent of changes seen on her scan. She does exhibit the common  triad of symptoms consistent with normal pressure hydrocephalus (i.e., urinary incontinence, gait/balance disturbances, and ongoing cognitive decline). Her pattern of deficits across testing is not inconsistent with individuals who have this neurological condition. However, anatomically, per the radiologist who read her December 2022 brain MRI, no ventriculomegaly was described, which would be atypical of this condition. I cannot rule out a very early presentation of Alzheimer's disease. Across memory testing, Brittney Malone did not benefit from repeated exposure to information across learning trials and was fully amnestic across all memory tasks after a brief delay. However, she was able to demonstrate some memory storage across recognition trials which does not suggest a profound memory storage impairment. She also performed well across confrontation naming and visuospatial abilities. While weakness was noted across semantic fluency, it appeared mildly improved relative to phonemic fluency tasks. All of these are inconsistent with typically presenting Alzheimer's disease. ? ?PREVIOUS MEDICATIONS:  ? ?CURRENT MEDICATIONS:  ?Outpatient Encounter Medications as of 09/13/2021  ?Medication Sig  ? acetaminophen (TYLENOL) 500 MG tablet Take 1,000 mg by mouth every 6 (six) hours as needed for pain.  ? carvedilol (COREG) 12.5 MG tablet TAKE 1 TABLET (12.5 MG TOTAL) BY MOUTH  2 (TWO) TIMES DAILY WITH A MEAL. NEED OFFICE VISIT  ? donepezil (ARICEPT) 5 MG tablet Take 5 mg by mouth at bedtime.  ? gabapentin (NEURONTIN) 300 MG capsule Take one capsule AM, one capsule at lunch and 2 capsules at bedtime  ? glipiZIDE (GLUCOTROL) 10 MG tablet 1 TABLET TWICE A DAY FOR DIABETES ORALLY 90 DAYS  ? levothyroxine (SYNTHROID, LEVOTHROID) 125 MCG tablet Take 125 mcg by mouth daily before breakfast.   ? losartan (COZAAR) 25 MG tablet TAKE 1 TABLET DAILY. PLEASE MAKE OVERDUE FOLLOW UP APPT FOR FURTHER REFILLS [phone]  ? memantine (NAMENDA)  10 MG tablet Take 10 mg by mouth 2 (two) times daily.  ? metFORMIN (GLUCOPHAGE-XR) 500 MG 24 hr tablet Take 500 mg by mouth 2 (two) times daily.  ? Misc Natural Products (OSTEO BI-FLEX JOINT SHIELD PO) T

## 2021-09-13 ENCOUNTER — Ambulatory Visit (INDEPENDENT_AMBULATORY_CARE_PROVIDER_SITE_OTHER): Payer: Medicare HMO | Admitting: Physician Assistant

## 2021-09-13 ENCOUNTER — Encounter: Payer: Self-pay | Admitting: Physician Assistant

## 2021-09-13 DIAGNOSIS — R269 Unspecified abnormalities of gait and mobility: Secondary | ICD-10-CM

## 2021-09-13 DIAGNOSIS — F01A0 Vascular dementia, mild, without behavioral disturbance, psychotic disturbance, mood disturbance, and anxiety: Secondary | ICD-10-CM | POA: Diagnosis not present

## 2021-09-13 DIAGNOSIS — R69 Illness, unspecified: Secondary | ICD-10-CM | POA: Diagnosis not present

## 2021-09-13 NOTE — Patient Instructions (Addendum)
It was a pleasure to see you today at our office.  ? ?Recommendations: ? ?Follow up 3 month July 14 at 11:30  ?Resume Donepezil 10 mg daily by PCP  ?Home Health Nursing  ?Home Physical Therapy for strength and balance  ? ? ?RECOMMENDATIONS FOR ALL PATIENTS WITH MEMORY PROBLEMS: ?1. Continue to exercise (Recommend 30 minutes of walking everyday, or 3 hours every week) ?2. Increase social interactions - continue going to [place] and enjoy social gatherings with friends and family ?3. Eat healthy, avoid fried foods and eat more fruits and vegetables ?4. Maintain adequate blood pressure, blood sugar, and blood cholesterol level. Reducing the risk of stroke and cardiovascular disease also helps promoting better memory. ?5. Avoid stressful situations. Live a simple life and avoid aggravations. Organize your time and prepare for the next day in anticipation. ?6. Sleep well, avoid any interruptions of sleep and avoid any distractions in the bedroom that may interfere with adequate sleep quality ?7. Avoid sugar, avoid sweets as there is a strong link between excessive sugar intake, diabetes, and cognitive impairment ?We discussed the Mediterranean diet, which has been shown to help patients reduce the risk of progressive memory disorders and reduces cardiovascular risk. This includes eating fish, eat fruits and green leafy vegetables, nuts like almonds and hazelnuts, walnuts, and also use olive oil. Avoid fast foods and fried foods as much as possible. Avoid sweets and sugar as sugar use has been linked to worsening of memory function. ? ?There is always a concern of gradual progression of memory problems. If this is the case, then we may need to adjust level of care according to patient needs. Support, both to the patient and caregiver, should then be put into place.  ? ? ? ? ? ?FALL PRECAUTIONS: Be cautious when walking. Scan the area for obstacles that may increase the risk of trips and falls. When getting up in the  mornings, sit up at the edge of the bed for a few minutes before getting out of bed. Consider elevating the bed at the head end to avoid drop of blood pressure when getting up. Walk always in a well-lit room (use night lights in the walls). Avoid area rugs or power cords from appliances in the middle of the walkways. Use a walker or a cane if necessary and consider physical therapy for balance exercise. Get your eyesight checked regularly. ? ?FINANCIAL OVERSIGHT: Supervision, especially oversight when making financial decisions or transactions is also recommended. ? ?HOME SAFETY: Consider the safety of the kitchen when operating appliances like stoves, microwave oven, and blender. Consider having supervision and share cooking responsibilities until no longer able to participate in those. Accidents with firearms and other hazards in the house should be identified and addressed as well. ? ? ?ABILITY TO BE LEFT ALONE: If patient is unable to contact 911 operator, consider using [employer], or when the need is there, arrange for someone to stay with patients. Smoking is a fire hazard, consider supervision or cessation. Risk of wandering should be assessed by caregiver and if detected at any point, supervision and safe proof recommendations should be instituted. ? ?MEDICATION SUPERVISION: Inability to self-administer medication needs to be constantly addressed. Implement a mechanism to ensure safe administration of the medications. ? ? ?DRIVING: Regarding driving, in patients with progressive memory problems, driving will be impaired. We advise to have someone else do the driving if trouble finding directions or if minor accidents are reported. Independent driving assessment is available to determine safety of  driving. ? ? ?If you are interested in the driving assessment, you can contact the following: ? ?[employer] in [place] ? ?[place] [phone] ? ?[facility] [phone] ? ?[facility] [phone] or [phone] ? ? ? ?Mediterranean Diet ?A Mediterranean diet refers to food and lifestyle choices that are based on the traditions of countries located on the [employer]. This way of eating has been shown to help prevent certain conditions and improve outcomes for people who have chronic diseases, like kidney disease and heart disease. ?What are tips for following this plan? ?Lifestyle  ?Cook and eat meals together with your family, when possible. ?Drink enough fluid to keep your urine clear or pale yellow. ?Be physically active every day. This includes: ?Aerobic exercise like running or swimming. ?Leisure activities like gardening, walking, or housework. ?Get 7-8 hours of sleep each night. ?If recommended by your health care provider, drink red wine in moderation. This means 1 glass a day for nonpregnant women and 2 glasses a day for men. A glass of wine equals 5 oz (150 mL). ?Reading food labels  ?Check the serving size of packaged foods. For foods such as rice and pasta, the serving size refers to the amount of cooked product, not dry. ?Check the total fat in packaged foods. Avoid foods that have saturated fat or trans fats. ?Check the ingredients list for added sugars, such as corn syrup. ?Shopping  ?At the grocery store, buy most of your food from the areas near the walls of the store. This includes: ?Fresh fruits and vegetables (produce). ?Grains, beans, nuts, and seeds. Some of these may be available in unpackaged forms or large amounts (in bulk). ?Fresh seafood. ?Poultry and eggs. ?Low-fat dairy products. ?Buy whole ingredients instead of prepackaged foods. ?Buy fresh fruits and vegetables in-season from local farmers markets. ?Buy frozen fruits and vegetables in resealable bags. ?If you do not have access to quality fresh seafood, buy precooked frozen shrimp or canned fish, such as tuna, salmon, or sardines. ?Buy small amounts of raw or cooked  vegetables, salads, or olives from the deli or salad bar at your store. ?Stock your pantry so you always have certain foods on hand, such as olive oil, canned tuna, canned tomatoes, rice, pasta, and beans. ?Cooking  ?Cook foods with extra-virgin olive oil instead of using butter or other vegetable oils. ?Have meat as a side dish, and have vegetables or grains as your main dish. This means having meat in small portions or adding small amounts of meat to foods like pasta or stew. ?Use beans or vegetables instead of meat in common dishes like chili or lasagna. ?Experiment with different cooking methods. Try roasting or broiling vegetables instead of steaming or saut?eing them. ?Add frozen vegetables to soups, stews, pasta, or rice. ?Add nuts or seeds for added healthy fat at each meal. You can add these to yogurt, salads, or vegetable dishes. ?Marinate fish or vegetables using olive oil, lemon juice, garlic, and fresh herbs. ?Meal planning  ?Plan to eat 1 vegetarian meal one day each week. Try to work up to 2 vegetarian meals, if possible. ?Eat seafood 2 or more times a week. ?Have healthy snacks readily available, such as: ?Vegetable sticks with hummus. ?[place] yogurt. ?Fruit and nut trail mix. ?Eat balanced meals throughout the week. This includes: ?Fruit: 2-3 servings a day ?Vegetables: 4-5 servings a day ?Low-fat dairy: 2 servings a day ?Fish, poultry, or lean meat: 1 serving a day ?Beans and legumes:  2 or more servings a week ?Nuts and seeds: 1-2 servings a day ?Whole grains: 6-8 servings a day ?Extra-virgin olive oil: 3-4 servings a day ?Limit red meat and sweets to only a few servings a month ?What are my food choices? ?Mediterranean diet ?Recommended ?Grains: Whole-grain pasta. Brown rice. Bulgar wheat. Polenta. Couscous. Whole-wheat bread. [place]. ?Vegetables: Artichokes. Beets. Broccoli. Cabbage. Carrots. Eggplant. Green beans. Chard. Kale. Spinach. Onions. Leeks. Peas. Squash. Tomatoes. Peppers.  Radishes. ?Fruits: Apples. Apricots. Avocado. Berries. Bananas. Cherries. Dates. Figs. Grapes. Lemons. Melon. Oranges. Peaches. Plums. Pomegranate. ?Meats and other protein foods: Beans. Almonds. Sunflower seeds.

## 2021-09-18 DIAGNOSIS — I2510 Atherosclerotic heart disease of native coronary artery without angina pectoris: Secondary | ICD-10-CM | POA: Diagnosis not present

## 2021-09-18 DIAGNOSIS — Z9181 History of falling: Secondary | ICD-10-CM | POA: Diagnosis not present

## 2021-09-18 DIAGNOSIS — E119 Type 2 diabetes mellitus without complications: Secondary | ICD-10-CM | POA: Diagnosis not present

## 2021-09-18 DIAGNOSIS — G2581 Restless legs syndrome: Secondary | ICD-10-CM | POA: Diagnosis not present

## 2021-09-18 DIAGNOSIS — E039 Hypothyroidism, unspecified: Secondary | ICD-10-CM | POA: Diagnosis not present

## 2021-09-18 DIAGNOSIS — I130 Hypertensive heart and chronic kidney disease with heart failure and stage 1 through stage 4 chronic kidney disease, or unspecified chronic kidney disease: Secondary | ICD-10-CM | POA: Diagnosis not present

## 2021-09-18 DIAGNOSIS — E7800 Pure hypercholesterolemia, unspecified: Secondary | ICD-10-CM | POA: Diagnosis not present

## 2021-09-18 DIAGNOSIS — I5022 Chronic systolic (congestive) heart failure: Secondary | ICD-10-CM | POA: Diagnosis not present

## 2021-09-18 DIAGNOSIS — Z7984 Long term (current) use of oral hypoglycemic drugs: Secondary | ICD-10-CM | POA: Diagnosis not present

## 2021-09-18 DIAGNOSIS — M170 Bilateral primary osteoarthritis of knee: Secondary | ICD-10-CM | POA: Diagnosis not present

## 2021-09-18 DIAGNOSIS — R69 Illness, unspecified: Secondary | ICD-10-CM | POA: Diagnosis not present

## 2021-09-18 DIAGNOSIS — I110 Hypertensive heart disease with heart failure: Secondary | ICD-10-CM | POA: Diagnosis not present

## 2021-09-20 DIAGNOSIS — Z9181 History of falling: Secondary | ICD-10-CM | POA: Diagnosis not present

## 2021-09-20 DIAGNOSIS — M170 Bilateral primary osteoarthritis of knee: Secondary | ICD-10-CM | POA: Diagnosis not present

## 2021-09-20 DIAGNOSIS — E119 Type 2 diabetes mellitus without complications: Secondary | ICD-10-CM | POA: Diagnosis not present

## 2021-09-20 DIAGNOSIS — Z7984 Long term (current) use of oral hypoglycemic drugs: Secondary | ICD-10-CM | POA: Diagnosis not present

## 2021-09-20 DIAGNOSIS — R69 Illness, unspecified: Secondary | ICD-10-CM | POA: Diagnosis not present

## 2021-09-20 DIAGNOSIS — I5022 Chronic systolic (congestive) heart failure: Secondary | ICD-10-CM | POA: Diagnosis not present

## 2021-09-20 DIAGNOSIS — I110 Hypertensive heart disease with heart failure: Secondary | ICD-10-CM | POA: Diagnosis not present

## 2021-09-20 DIAGNOSIS — E039 Hypothyroidism, unspecified: Secondary | ICD-10-CM | POA: Diagnosis not present

## 2021-09-20 DIAGNOSIS — G2581 Restless legs syndrome: Secondary | ICD-10-CM | POA: Diagnosis not present

## 2021-09-20 DIAGNOSIS — I2510 Atherosclerotic heart disease of native coronary artery without angina pectoris: Secondary | ICD-10-CM | POA: Diagnosis not present

## 2021-09-20 DIAGNOSIS — E7800 Pure hypercholesterolemia, unspecified: Secondary | ICD-10-CM | POA: Diagnosis not present

## 2021-09-24 DIAGNOSIS — G2581 Restless legs syndrome: Secondary | ICD-10-CM | POA: Diagnosis not present

## 2021-09-24 DIAGNOSIS — I5022 Chronic systolic (congestive) heart failure: Secondary | ICD-10-CM | POA: Diagnosis not present

## 2021-09-24 DIAGNOSIS — I110 Hypertensive heart disease with heart failure: Secondary | ICD-10-CM | POA: Diagnosis not present

## 2021-09-24 DIAGNOSIS — E119 Type 2 diabetes mellitus without complications: Secondary | ICD-10-CM | POA: Diagnosis not present

## 2021-09-24 DIAGNOSIS — I2510 Atherosclerotic heart disease of native coronary artery without angina pectoris: Secondary | ICD-10-CM | POA: Diagnosis not present

## 2021-09-24 DIAGNOSIS — R69 Illness, unspecified: Secondary | ICD-10-CM | POA: Diagnosis not present

## 2021-09-24 DIAGNOSIS — E7800 Pure hypercholesterolemia, unspecified: Secondary | ICD-10-CM | POA: Diagnosis not present

## 2021-09-24 DIAGNOSIS — Z7984 Long term (current) use of oral hypoglycemic drugs: Secondary | ICD-10-CM | POA: Diagnosis not present

## 2021-09-24 DIAGNOSIS — M170 Bilateral primary osteoarthritis of knee: Secondary | ICD-10-CM | POA: Diagnosis not present

## 2021-09-24 DIAGNOSIS — E039 Hypothyroidism, unspecified: Secondary | ICD-10-CM | POA: Diagnosis not present

## 2021-09-24 DIAGNOSIS — Z9181 History of falling: Secondary | ICD-10-CM | POA: Diagnosis not present

## 2021-09-27 DIAGNOSIS — E119 Type 2 diabetes mellitus without complications: Secondary | ICD-10-CM | POA: Diagnosis not present

## 2021-09-27 DIAGNOSIS — M170 Bilateral primary osteoarthritis of knee: Secondary | ICD-10-CM | POA: Diagnosis not present

## 2021-09-27 DIAGNOSIS — Z7984 Long term (current) use of oral hypoglycemic drugs: Secondary | ICD-10-CM | POA: Diagnosis not present

## 2021-09-27 DIAGNOSIS — I5022 Chronic systolic (congestive) heart failure: Secondary | ICD-10-CM | POA: Diagnosis not present

## 2021-09-27 DIAGNOSIS — I2510 Atherosclerotic heart disease of native coronary artery without angina pectoris: Secondary | ICD-10-CM | POA: Diagnosis not present

## 2021-09-27 DIAGNOSIS — E039 Hypothyroidism, unspecified: Secondary | ICD-10-CM | POA: Diagnosis not present

## 2021-09-27 DIAGNOSIS — I110 Hypertensive heart disease with heart failure: Secondary | ICD-10-CM | POA: Diagnosis not present

## 2021-09-27 DIAGNOSIS — R69 Illness, unspecified: Secondary | ICD-10-CM | POA: Diagnosis not present

## 2021-09-27 DIAGNOSIS — Z9181 History of falling: Secondary | ICD-10-CM | POA: Diagnosis not present

## 2021-09-27 DIAGNOSIS — E7800 Pure hypercholesterolemia, unspecified: Secondary | ICD-10-CM | POA: Diagnosis not present

## 2021-09-27 DIAGNOSIS — G2581 Restless legs syndrome: Secondary | ICD-10-CM | POA: Diagnosis not present

## 2021-09-29 DIAGNOSIS — R69 Illness, unspecified: Secondary | ICD-10-CM | POA: Diagnosis not present

## 2021-09-29 DIAGNOSIS — L309 Dermatitis, unspecified: Secondary | ICD-10-CM | POA: Diagnosis not present

## 2021-09-29 DIAGNOSIS — I10 Essential (primary) hypertension: Secondary | ICD-10-CM | POA: Diagnosis not present

## 2021-09-29 DIAGNOSIS — E039 Hypothyroidism, unspecified: Secondary | ICD-10-CM | POA: Diagnosis not present

## 2021-09-29 DIAGNOSIS — E7800 Pure hypercholesterolemia, unspecified: Secondary | ICD-10-CM | POA: Diagnosis not present

## 2021-09-29 DIAGNOSIS — E2839 Other primary ovarian failure: Secondary | ICD-10-CM | POA: Diagnosis not present

## 2021-09-29 DIAGNOSIS — R8290 Unspecified abnormal findings in urine: Secondary | ICD-10-CM | POA: Diagnosis not present

## 2021-09-29 DIAGNOSIS — E1142 Type 2 diabetes mellitus with diabetic polyneuropathy: Secondary | ICD-10-CM | POA: Diagnosis not present

## 2021-09-29 DIAGNOSIS — Z0000 Encounter for general adult medical examination without abnormal findings: Secondary | ICD-10-CM | POA: Diagnosis not present

## 2021-09-30 ENCOUNTER — Other Ambulatory Visit: Payer: Self-pay | Admitting: Family Medicine

## 2021-09-30 DIAGNOSIS — M170 Bilateral primary osteoarthritis of knee: Secondary | ICD-10-CM | POA: Diagnosis not present

## 2021-09-30 DIAGNOSIS — Z9181 History of falling: Secondary | ICD-10-CM | POA: Diagnosis not present

## 2021-09-30 DIAGNOSIS — E039 Hypothyroidism, unspecified: Secondary | ICD-10-CM | POA: Diagnosis not present

## 2021-09-30 DIAGNOSIS — I110 Hypertensive heart disease with heart failure: Secondary | ICD-10-CM | POA: Diagnosis not present

## 2021-09-30 DIAGNOSIS — R69 Illness, unspecified: Secondary | ICD-10-CM | POA: Diagnosis not present

## 2021-09-30 DIAGNOSIS — Z7984 Long term (current) use of oral hypoglycemic drugs: Secondary | ICD-10-CM | POA: Diagnosis not present

## 2021-09-30 DIAGNOSIS — I5022 Chronic systolic (congestive) heart failure: Secondary | ICD-10-CM | POA: Diagnosis not present

## 2021-09-30 DIAGNOSIS — E2839 Other primary ovarian failure: Secondary | ICD-10-CM

## 2021-09-30 DIAGNOSIS — I2510 Atherosclerotic heart disease of native coronary artery without angina pectoris: Secondary | ICD-10-CM | POA: Diagnosis not present

## 2021-09-30 DIAGNOSIS — E7800 Pure hypercholesterolemia, unspecified: Secondary | ICD-10-CM | POA: Diagnosis not present

## 2021-09-30 DIAGNOSIS — Z1231 Encounter for screening mammogram for malignant neoplasm of breast: Secondary | ICD-10-CM

## 2021-09-30 DIAGNOSIS — E119 Type 2 diabetes mellitus without complications: Secondary | ICD-10-CM | POA: Diagnosis not present

## 2021-09-30 DIAGNOSIS — G2581 Restless legs syndrome: Secondary | ICD-10-CM | POA: Diagnosis not present

## 2021-10-01 DIAGNOSIS — E7800 Pure hypercholesterolemia, unspecified: Secondary | ICD-10-CM | POA: Diagnosis not present

## 2021-10-01 DIAGNOSIS — I5022 Chronic systolic (congestive) heart failure: Secondary | ICD-10-CM | POA: Diagnosis not present

## 2021-10-01 DIAGNOSIS — I2510 Atherosclerotic heart disease of native coronary artery without angina pectoris: Secondary | ICD-10-CM | POA: Diagnosis not present

## 2021-10-01 DIAGNOSIS — E039 Hypothyroidism, unspecified: Secondary | ICD-10-CM | POA: Diagnosis not present

## 2021-10-01 DIAGNOSIS — M170 Bilateral primary osteoarthritis of knee: Secondary | ICD-10-CM | POA: Diagnosis not present

## 2021-10-01 DIAGNOSIS — R69 Illness, unspecified: Secondary | ICD-10-CM | POA: Diagnosis not present

## 2021-10-01 DIAGNOSIS — E119 Type 2 diabetes mellitus without complications: Secondary | ICD-10-CM | POA: Diagnosis not present

## 2021-10-01 DIAGNOSIS — Z7984 Long term (current) use of oral hypoglycemic drugs: Secondary | ICD-10-CM | POA: Diagnosis not present

## 2021-10-01 DIAGNOSIS — G2581 Restless legs syndrome: Secondary | ICD-10-CM | POA: Diagnosis not present

## 2021-10-01 DIAGNOSIS — I110 Hypertensive heart disease with heart failure: Secondary | ICD-10-CM | POA: Diagnosis not present

## 2021-10-01 DIAGNOSIS — Z9181 History of falling: Secondary | ICD-10-CM | POA: Diagnosis not present

## 2021-10-04 DIAGNOSIS — E119 Type 2 diabetes mellitus without complications: Secondary | ICD-10-CM | POA: Diagnosis not present

## 2021-10-04 DIAGNOSIS — E039 Hypothyroidism, unspecified: Secondary | ICD-10-CM | POA: Diagnosis not present

## 2021-10-04 DIAGNOSIS — R69 Illness, unspecified: Secondary | ICD-10-CM | POA: Diagnosis not present

## 2021-10-04 DIAGNOSIS — G2581 Restless legs syndrome: Secondary | ICD-10-CM | POA: Diagnosis not present

## 2021-10-04 DIAGNOSIS — Z7984 Long term (current) use of oral hypoglycemic drugs: Secondary | ICD-10-CM | POA: Diagnosis not present

## 2021-10-04 DIAGNOSIS — I110 Hypertensive heart disease with heart failure: Secondary | ICD-10-CM | POA: Diagnosis not present

## 2021-10-04 DIAGNOSIS — E7800 Pure hypercholesterolemia, unspecified: Secondary | ICD-10-CM | POA: Diagnosis not present

## 2021-10-04 DIAGNOSIS — M170 Bilateral primary osteoarthritis of knee: Secondary | ICD-10-CM | POA: Diagnosis not present

## 2021-10-04 DIAGNOSIS — I2510 Atherosclerotic heart disease of native coronary artery without angina pectoris: Secondary | ICD-10-CM | POA: Diagnosis not present

## 2021-10-04 DIAGNOSIS — I5022 Chronic systolic (congestive) heart failure: Secondary | ICD-10-CM | POA: Diagnosis not present

## 2021-10-04 DIAGNOSIS — Z9181 History of falling: Secondary | ICD-10-CM | POA: Diagnosis not present

## 2021-10-06 DIAGNOSIS — Z9181 History of falling: Secondary | ICD-10-CM | POA: Diagnosis not present

## 2021-10-06 DIAGNOSIS — Z7984 Long term (current) use of oral hypoglycemic drugs: Secondary | ICD-10-CM | POA: Diagnosis not present

## 2021-10-06 DIAGNOSIS — E7800 Pure hypercholesterolemia, unspecified: Secondary | ICD-10-CM | POA: Diagnosis not present

## 2021-10-06 DIAGNOSIS — E119 Type 2 diabetes mellitus without complications: Secondary | ICD-10-CM | POA: Diagnosis not present

## 2021-10-06 DIAGNOSIS — I5022 Chronic systolic (congestive) heart failure: Secondary | ICD-10-CM | POA: Diagnosis not present

## 2021-10-06 DIAGNOSIS — I2510 Atherosclerotic heart disease of native coronary artery without angina pectoris: Secondary | ICD-10-CM | POA: Diagnosis not present

## 2021-10-06 DIAGNOSIS — M170 Bilateral primary osteoarthritis of knee: Secondary | ICD-10-CM | POA: Diagnosis not present

## 2021-10-06 DIAGNOSIS — I110 Hypertensive heart disease with heart failure: Secondary | ICD-10-CM | POA: Diagnosis not present

## 2021-10-06 DIAGNOSIS — E039 Hypothyroidism, unspecified: Secondary | ICD-10-CM | POA: Diagnosis not present

## 2021-10-06 DIAGNOSIS — R69 Illness, unspecified: Secondary | ICD-10-CM | POA: Diagnosis not present

## 2021-10-06 DIAGNOSIS — G2581 Restless legs syndrome: Secondary | ICD-10-CM | POA: Diagnosis not present

## 2021-10-08 DIAGNOSIS — I110 Hypertensive heart disease with heart failure: Secondary | ICD-10-CM | POA: Diagnosis not present

## 2021-10-08 DIAGNOSIS — Z7984 Long term (current) use of oral hypoglycemic drugs: Secondary | ICD-10-CM | POA: Diagnosis not present

## 2021-10-08 DIAGNOSIS — E119 Type 2 diabetes mellitus without complications: Secondary | ICD-10-CM | POA: Diagnosis not present

## 2021-10-08 DIAGNOSIS — Z9181 History of falling: Secondary | ICD-10-CM | POA: Diagnosis not present

## 2021-10-08 DIAGNOSIS — R69 Illness, unspecified: Secondary | ICD-10-CM | POA: Diagnosis not present

## 2021-10-08 DIAGNOSIS — E039 Hypothyroidism, unspecified: Secondary | ICD-10-CM | POA: Diagnosis not present

## 2021-10-08 DIAGNOSIS — G2581 Restless legs syndrome: Secondary | ICD-10-CM | POA: Diagnosis not present

## 2021-10-08 DIAGNOSIS — E7800 Pure hypercholesterolemia, unspecified: Secondary | ICD-10-CM | POA: Diagnosis not present

## 2021-10-08 DIAGNOSIS — M170 Bilateral primary osteoarthritis of knee: Secondary | ICD-10-CM | POA: Diagnosis not present

## 2021-10-08 DIAGNOSIS — I2510 Atherosclerotic heart disease of native coronary artery without angina pectoris: Secondary | ICD-10-CM | POA: Diagnosis not present

## 2021-10-08 DIAGNOSIS — I5022 Chronic systolic (congestive) heart failure: Secondary | ICD-10-CM | POA: Diagnosis not present

## 2021-10-11 DIAGNOSIS — E119 Type 2 diabetes mellitus without complications: Secondary | ICD-10-CM | POA: Diagnosis not present

## 2021-10-11 DIAGNOSIS — M170 Bilateral primary osteoarthritis of knee: Secondary | ICD-10-CM | POA: Diagnosis not present

## 2021-10-11 DIAGNOSIS — E7800 Pure hypercholesterolemia, unspecified: Secondary | ICD-10-CM | POA: Diagnosis not present

## 2021-10-11 DIAGNOSIS — I110 Hypertensive heart disease with heart failure: Secondary | ICD-10-CM | POA: Diagnosis not present

## 2021-10-11 DIAGNOSIS — I2510 Atherosclerotic heart disease of native coronary artery without angina pectoris: Secondary | ICD-10-CM | POA: Diagnosis not present

## 2021-10-11 DIAGNOSIS — G2581 Restless legs syndrome: Secondary | ICD-10-CM | POA: Diagnosis not present

## 2021-10-11 DIAGNOSIS — Z9181 History of falling: Secondary | ICD-10-CM | POA: Diagnosis not present

## 2021-10-11 DIAGNOSIS — R69 Illness, unspecified: Secondary | ICD-10-CM | POA: Diagnosis not present

## 2021-10-11 DIAGNOSIS — I5022 Chronic systolic (congestive) heart failure: Secondary | ICD-10-CM | POA: Diagnosis not present

## 2021-10-11 DIAGNOSIS — E039 Hypothyroidism, unspecified: Secondary | ICD-10-CM | POA: Diagnosis not present

## 2021-10-11 DIAGNOSIS — Z7984 Long term (current) use of oral hypoglycemic drugs: Secondary | ICD-10-CM | POA: Diagnosis not present

## 2021-10-14 DIAGNOSIS — I5022 Chronic systolic (congestive) heart failure: Secondary | ICD-10-CM | POA: Diagnosis not present

## 2021-10-14 DIAGNOSIS — R69 Illness, unspecified: Secondary | ICD-10-CM | POA: Diagnosis not present

## 2021-10-14 DIAGNOSIS — G2581 Restless legs syndrome: Secondary | ICD-10-CM | POA: Diagnosis not present

## 2021-10-14 DIAGNOSIS — E039 Hypothyroidism, unspecified: Secondary | ICD-10-CM | POA: Diagnosis not present

## 2021-10-14 DIAGNOSIS — E7800 Pure hypercholesterolemia, unspecified: Secondary | ICD-10-CM | POA: Diagnosis not present

## 2021-10-14 DIAGNOSIS — I2510 Atherosclerotic heart disease of native coronary artery without angina pectoris: Secondary | ICD-10-CM | POA: Diagnosis not present

## 2021-10-14 DIAGNOSIS — M170 Bilateral primary osteoarthritis of knee: Secondary | ICD-10-CM | POA: Diagnosis not present

## 2021-10-14 DIAGNOSIS — I110 Hypertensive heart disease with heart failure: Secondary | ICD-10-CM | POA: Diagnosis not present

## 2021-10-14 DIAGNOSIS — Z7984 Long term (current) use of oral hypoglycemic drugs: Secondary | ICD-10-CM | POA: Diagnosis not present

## 2021-10-14 DIAGNOSIS — Z9181 History of falling: Secondary | ICD-10-CM | POA: Diagnosis not present

## 2021-10-14 DIAGNOSIS — E119 Type 2 diabetes mellitus without complications: Secondary | ICD-10-CM | POA: Diagnosis not present

## 2021-10-15 DIAGNOSIS — E039 Hypothyroidism, unspecified: Secondary | ICD-10-CM | POA: Diagnosis not present

## 2021-10-15 DIAGNOSIS — Z7984 Long term (current) use of oral hypoglycemic drugs: Secondary | ICD-10-CM | POA: Diagnosis not present

## 2021-10-15 DIAGNOSIS — I5022 Chronic systolic (congestive) heart failure: Secondary | ICD-10-CM | POA: Diagnosis not present

## 2021-10-15 DIAGNOSIS — R69 Illness, unspecified: Secondary | ICD-10-CM | POA: Diagnosis not present

## 2021-10-15 DIAGNOSIS — E119 Type 2 diabetes mellitus without complications: Secondary | ICD-10-CM | POA: Diagnosis not present

## 2021-10-15 DIAGNOSIS — I110 Hypertensive heart disease with heart failure: Secondary | ICD-10-CM | POA: Diagnosis not present

## 2021-10-15 DIAGNOSIS — I10 Essential (primary) hypertension: Secondary | ICD-10-CM | POA: Diagnosis not present

## 2021-10-15 DIAGNOSIS — G2581 Restless legs syndrome: Secondary | ICD-10-CM | POA: Diagnosis not present

## 2021-10-15 DIAGNOSIS — E1142 Type 2 diabetes mellitus with diabetic polyneuropathy: Secondary | ICD-10-CM | POA: Diagnosis not present

## 2021-10-15 DIAGNOSIS — I2510 Atherosclerotic heart disease of native coronary artery without angina pectoris: Secondary | ICD-10-CM | POA: Diagnosis not present

## 2021-10-15 DIAGNOSIS — Z9181 History of falling: Secondary | ICD-10-CM | POA: Diagnosis not present

## 2021-10-15 DIAGNOSIS — M170 Bilateral primary osteoarthritis of knee: Secondary | ICD-10-CM | POA: Diagnosis not present

## 2021-10-15 DIAGNOSIS — E7800 Pure hypercholesterolemia, unspecified: Secondary | ICD-10-CM | POA: Diagnosis not present

## 2021-10-18 DIAGNOSIS — Z9181 History of falling: Secondary | ICD-10-CM | POA: Diagnosis not present

## 2021-10-18 DIAGNOSIS — G2581 Restless legs syndrome: Secondary | ICD-10-CM | POA: Diagnosis not present

## 2021-10-18 DIAGNOSIS — M170 Bilateral primary osteoarthritis of knee: Secondary | ICD-10-CM | POA: Diagnosis not present

## 2021-10-18 DIAGNOSIS — I2510 Atherosclerotic heart disease of native coronary artery without angina pectoris: Secondary | ICD-10-CM | POA: Diagnosis not present

## 2021-10-18 DIAGNOSIS — I110 Hypertensive heart disease with heart failure: Secondary | ICD-10-CM | POA: Diagnosis not present

## 2021-10-18 DIAGNOSIS — E119 Type 2 diabetes mellitus without complications: Secondary | ICD-10-CM | POA: Diagnosis not present

## 2021-10-18 DIAGNOSIS — Z7984 Long term (current) use of oral hypoglycemic drugs: Secondary | ICD-10-CM | POA: Diagnosis not present

## 2021-10-18 DIAGNOSIS — E7800 Pure hypercholesterolemia, unspecified: Secondary | ICD-10-CM | POA: Diagnosis not present

## 2021-10-18 DIAGNOSIS — R69 Illness, unspecified: Secondary | ICD-10-CM | POA: Diagnosis not present

## 2021-10-18 DIAGNOSIS — I5022 Chronic systolic (congestive) heart failure: Secondary | ICD-10-CM | POA: Diagnosis not present

## 2021-10-18 DIAGNOSIS — E039 Hypothyroidism, unspecified: Secondary | ICD-10-CM | POA: Diagnosis not present

## 2021-10-21 DIAGNOSIS — I5022 Chronic systolic (congestive) heart failure: Secondary | ICD-10-CM | POA: Diagnosis not present

## 2021-10-21 DIAGNOSIS — G2581 Restless legs syndrome: Secondary | ICD-10-CM | POA: Diagnosis not present

## 2021-10-21 DIAGNOSIS — E039 Hypothyroidism, unspecified: Secondary | ICD-10-CM | POA: Diagnosis not present

## 2021-10-21 DIAGNOSIS — R69 Illness, unspecified: Secondary | ICD-10-CM | POA: Diagnosis not present

## 2021-10-21 DIAGNOSIS — M170 Bilateral primary osteoarthritis of knee: Secondary | ICD-10-CM | POA: Diagnosis not present

## 2021-10-21 DIAGNOSIS — E119 Type 2 diabetes mellitus without complications: Secondary | ICD-10-CM | POA: Diagnosis not present

## 2021-10-21 DIAGNOSIS — Z9181 History of falling: Secondary | ICD-10-CM | POA: Diagnosis not present

## 2021-10-21 DIAGNOSIS — I110 Hypertensive heart disease with heart failure: Secondary | ICD-10-CM | POA: Diagnosis not present

## 2021-10-21 DIAGNOSIS — E7800 Pure hypercholesterolemia, unspecified: Secondary | ICD-10-CM | POA: Diagnosis not present

## 2021-10-21 DIAGNOSIS — Z7984 Long term (current) use of oral hypoglycemic drugs: Secondary | ICD-10-CM | POA: Diagnosis not present

## 2021-10-21 DIAGNOSIS — I2510 Atherosclerotic heart disease of native coronary artery without angina pectoris: Secondary | ICD-10-CM | POA: Diagnosis not present

## 2021-10-22 DIAGNOSIS — E039 Hypothyroidism, unspecified: Secondary | ICD-10-CM | POA: Diagnosis not present

## 2021-10-22 DIAGNOSIS — I110 Hypertensive heart disease with heart failure: Secondary | ICD-10-CM | POA: Diagnosis not present

## 2021-10-22 DIAGNOSIS — E7800 Pure hypercholesterolemia, unspecified: Secondary | ICD-10-CM | POA: Diagnosis not present

## 2021-10-22 DIAGNOSIS — M170 Bilateral primary osteoarthritis of knee: Secondary | ICD-10-CM | POA: Diagnosis not present

## 2021-10-22 DIAGNOSIS — E119 Type 2 diabetes mellitus without complications: Secondary | ICD-10-CM | POA: Diagnosis not present

## 2021-10-22 DIAGNOSIS — R69 Illness, unspecified: Secondary | ICD-10-CM | POA: Diagnosis not present

## 2021-10-22 DIAGNOSIS — I2510 Atherosclerotic heart disease of native coronary artery without angina pectoris: Secondary | ICD-10-CM | POA: Diagnosis not present

## 2021-10-22 DIAGNOSIS — Z9181 History of falling: Secondary | ICD-10-CM | POA: Diagnosis not present

## 2021-10-22 DIAGNOSIS — G2581 Restless legs syndrome: Secondary | ICD-10-CM | POA: Diagnosis not present

## 2021-10-22 DIAGNOSIS — I5022 Chronic systolic (congestive) heart failure: Secondary | ICD-10-CM | POA: Diagnosis not present

## 2021-10-22 DIAGNOSIS — Z7984 Long term (current) use of oral hypoglycemic drugs: Secondary | ICD-10-CM | POA: Diagnosis not present

## 2021-10-25 DIAGNOSIS — I5022 Chronic systolic (congestive) heart failure: Secondary | ICD-10-CM | POA: Diagnosis not present

## 2021-10-25 DIAGNOSIS — M170 Bilateral primary osteoarthritis of knee: Secondary | ICD-10-CM | POA: Diagnosis not present

## 2021-10-25 DIAGNOSIS — I110 Hypertensive heart disease with heart failure: Secondary | ICD-10-CM | POA: Diagnosis not present

## 2021-10-25 DIAGNOSIS — I2510 Atherosclerotic heart disease of native coronary artery without angina pectoris: Secondary | ICD-10-CM | POA: Diagnosis not present

## 2021-10-25 DIAGNOSIS — Z7984 Long term (current) use of oral hypoglycemic drugs: Secondary | ICD-10-CM | POA: Diagnosis not present

## 2021-10-25 DIAGNOSIS — Z9181 History of falling: Secondary | ICD-10-CM | POA: Diagnosis not present

## 2021-10-25 DIAGNOSIS — E039 Hypothyroidism, unspecified: Secondary | ICD-10-CM | POA: Diagnosis not present

## 2021-10-25 DIAGNOSIS — G2581 Restless legs syndrome: Secondary | ICD-10-CM | POA: Diagnosis not present

## 2021-10-25 DIAGNOSIS — E119 Type 2 diabetes mellitus without complications: Secondary | ICD-10-CM | POA: Diagnosis not present

## 2021-10-25 DIAGNOSIS — R69 Illness, unspecified: Secondary | ICD-10-CM | POA: Diagnosis not present

## 2021-10-25 DIAGNOSIS — E7800 Pure hypercholesterolemia, unspecified: Secondary | ICD-10-CM | POA: Diagnosis not present

## 2021-11-03 DIAGNOSIS — E7800 Pure hypercholesterolemia, unspecified: Secondary | ICD-10-CM | POA: Diagnosis not present

## 2021-11-03 DIAGNOSIS — M170 Bilateral primary osteoarthritis of knee: Secondary | ICD-10-CM | POA: Diagnosis not present

## 2021-11-03 DIAGNOSIS — I2510 Atherosclerotic heart disease of native coronary artery without angina pectoris: Secondary | ICD-10-CM | POA: Diagnosis not present

## 2021-11-03 DIAGNOSIS — I110 Hypertensive heart disease with heart failure: Secondary | ICD-10-CM | POA: Diagnosis not present

## 2021-11-03 DIAGNOSIS — Z9181 History of falling: Secondary | ICD-10-CM | POA: Diagnosis not present

## 2021-11-03 DIAGNOSIS — I5022 Chronic systolic (congestive) heart failure: Secondary | ICD-10-CM | POA: Diagnosis not present

## 2021-11-03 DIAGNOSIS — E119 Type 2 diabetes mellitus without complications: Secondary | ICD-10-CM | POA: Diagnosis not present

## 2021-11-03 DIAGNOSIS — G2581 Restless legs syndrome: Secondary | ICD-10-CM | POA: Diagnosis not present

## 2021-11-03 DIAGNOSIS — Z7984 Long term (current) use of oral hypoglycemic drugs: Secondary | ICD-10-CM | POA: Diagnosis not present

## 2021-11-03 DIAGNOSIS — E039 Hypothyroidism, unspecified: Secondary | ICD-10-CM | POA: Diagnosis not present

## 2021-11-03 DIAGNOSIS — R69 Illness, unspecified: Secondary | ICD-10-CM | POA: Diagnosis not present

## 2021-12-16 DIAGNOSIS — E039 Hypothyroidism, unspecified: Secondary | ICD-10-CM | POA: Diagnosis not present

## 2021-12-16 DIAGNOSIS — I10 Essential (primary) hypertension: Secondary | ICD-10-CM | POA: Diagnosis not present

## 2021-12-16 DIAGNOSIS — E7800 Pure hypercholesterolemia, unspecified: Secondary | ICD-10-CM | POA: Diagnosis not present

## 2021-12-16 DIAGNOSIS — E1142 Type 2 diabetes mellitus with diabetic polyneuropathy: Secondary | ICD-10-CM | POA: Diagnosis not present

## 2021-12-17 ENCOUNTER — Ambulatory Visit: Payer: Medicare HMO | Admitting: Physician Assistant

## 2021-12-17 VITALS — BP 129/64 | HR 74 | Resp 18 | Ht 62.0 in | Wt 173.0 lb

## 2021-12-17 DIAGNOSIS — F01A0 Vascular dementia, mild, without behavioral disturbance, psychotic disturbance, mood disturbance, and anxiety: Secondary | ICD-10-CM | POA: Diagnosis not present

## 2021-12-17 DIAGNOSIS — R69 Illness, unspecified: Secondary | ICD-10-CM | POA: Diagnosis not present

## 2021-12-17 NOTE — Progress Notes (Signed)
Assessment/Plan:   Dementia likely due to  Brittney Malone is a very pleasant 81 y.o. RH female with a history of hypertension, hyperlipidemia, CAD, anxiety, depression seen today in follow up for memory loss.seen today in follow up for memory loss. Patient is currently on donepezil 10 mg daily by PCP.  She is also on B12 supplementation by PCP for B12 deficiency.  She was last seen at our office on 09/13/2021 after evaluation by Dr. 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  She is overall doing well from the cognitive standpoint.   Recommendations:    Continue donepezil 10 mg   Side effects were discussed Follow up in 6  months. Continue donepezil 10 mg daily by PCP. Continue B12 replenishment. Monitor driving   Case discussed with Dr. Delice Lesch who agrees with the plan     Subjective:    This patient is accompanied in the office by her daughter who supplements the history.  Previous records as well as any outside records available were reviewed prior to todays visit. Patient was last seen at our office on 09/13/2021.  Last MoCA was 19/30.   Any changes in memory since last visit? No changes.  She reports looking after her grandson, and she has been attending some family functions, she is more active.  She also enjoys going to church. Patient lives with: Husband repeats oneself?  Endorsed Disoriented when walking into a room?  Patient denies   Leaving objects in unusual places?  Patient denies, but she does misplace them sometimes.  She also has some hoarding behavior which has been presenting prior visit. Ambulates  with difficulty?   Patient denies.  In the recent past, PT and OT were recommended by her PCP for improvement of strength and balance, she has a history of mild gait instability due to arthritis, but the patient declined repeatedly. Recent falls?  Patient denies   Any head injuries?  Patient denies   History of seizures?   Patient denies   Wandering  behavior?  Patient denies   Patient drives?   Patient no longer drives  Any mood changes such irritability agitation?  Patient denies   Any history of depression?:  Patient denies   Hallucinations?  Patient denies   Paranoia?  Patient denies   Patient reports that he sleeps well without vivid dreams, REM behavior or sleepwalking   History of sleep apnea?  Patient denies   Any hygiene concerns?  Patient denies new hygiene concerns, although she has less interest in bathing and dressing. Independent of bathing and dressing?  Endorsed  Does the patient needs help with medications?  Daughter is in charge.  Who is in charge of the finances?  Son is in charge    Any changes in appetite?  Patient denies   Patient have trouble swallowing? Patient denies   Does the patient cook?  Patient denies   Any kitchen accidents such as leaving the stove on? Patient denies   Any headaches?  Patient denies   Double vision? Patient denies   Any focal numbness or tingling?  Patient denies   Chronic back pain Patient denies   Unilateral weakness?  Patient denies   Any tremors?  Patient denies   Any history of anosmia?  Patient denies   Any incontinence of urine?  She is urine incontinence, but does not like wearing diapers if she goes out Any bowel dysfunction?   Patient denies  Initial visit 19.12.22 The patient is seen in neurologic consultation at the request of [name], MD for the evaluation of memory.  The patient is accompanied by daughter in law  who supplements the history. This is a 81 y.o. year old female who has had memory issues for about 3 years, when she began to ask the same questions, tell the same stories or same conversations, losing objects, placing them anywhere around the house.  Also, she has been noted to not complete her chores as before, such as not doing the laundry, not cooking, cleaning, and her hoarding has become more pronounced.  "She only remembers to volunteer at  the church and at the thrift shop to every Tuesday and every Saturday once a month ".  She likes to watch TV.  She lives with her husband who is in good health.  Her mood is "very playful ".  She denies any depression or irritability, sleeps well without vivid dreams or sleepwalking, hallucinations or paranoia.  For the last 2 years, she has been showing less interest in bathing and dressing.  Her daughter-in-law manages the medications since August of this year, because she was not taking them, or putting them in somewhere where she cannot find them.  Her son manages the finances, because she was not keeping up with the bills.  Her appetite is good denies trouble swallowing.  She ambulates without the use of a walker or a cane, but she is more prone to falls since 2021, having bruised her tailbone, she refuses to use the devices "at all".  She denies any head injuries.  Denies any headaches, double vision, dizziness, focal numbness or tingling, unilateral weakness or tremors.  She is urine incontinent, and she "leaves deposits everywhere ".  She does not like to wear diapers unless she goes out.  She has also chronic "stool accidents ".  Denies anosmia.  Denies a history of sleep apnea, alcohol or tobacco.  No family history of dementia.       Neuropsych testing 08/2021 Dr. Melvyn Novas Briefly, results suggested severe impairment surrounding encoding (i.e., learning) and delayed retrieval aspects of verbal and visual memory. Further weaknesses were exhibited across complex attention, executive functioning, and verbal fluency. The etiology of ongoing dysfunction is unclear at the present time and further work-up and monitoring will be vital moving forward. There could also be a vascular contribution to ongoing dysfunction given her medical history (i.e., atrial tachycardia, systolic heart failure, cardiomyopathy, hypertension, hyperlipidemia, type II diabetes) and neuroimaging suggesting microvascular ischemic changes, it  is my opinion that this somewhat minimizes the extent of changes seen on her scan. She does exhibit the common triad of symptoms consistent with normal pressure hydrocephalus (i.e., urinary incontinence, gait/balance disturbances, and ongoing cognitive decline). Her pattern of deficits across testing is not inconsistent with individuals who have this neurological condition. However, anatomically, per the radiologist who read her December 2022 brain MRI, no ventriculomegaly was described, which would be atypical of this condition. I cannot rule out a very early presentation of Alzheimer's disease. Across memory testing, Ms. Moorehouse did not benefit from repeated exposure to information across learning trials and was fully amnestic across all memory tasks after a brief delay. However, she was able to demonstrate some memory storage across recognition trials which does not suggest a profound memory storage impairment. She also performed well across confrontation naming and visuospatial abilities. While weakness was noted across semantic fluency, it appeared mildly improved relative to phonemic fluency tasks. All  of these are inconsistent with typically presenting Alzheimer's disease.    PREVIOUS MEDICATIONS:   CURRENT MEDICATIONS:  Outpatient Encounter Medications as of 12/17/2021  Medication Sig   acetaminophen (TYLENOL) 500 MG tablet Take 1,000 mg by mouth every 6 (six) hours as needed for pain.   carvedilol (COREG) 12.5 MG tablet TAKE 1 TABLET (12.5 MG TOTAL) BY MOUTH 2 (TWO) TIMES DAILY WITH A MEAL. NEED OFFICE VISIT   donepezil (ARICEPT) 5 MG tablet Take 5 mg by mouth at bedtime.   gabapentin (NEURONTIN) 300 MG capsule Take one capsule AM, one capsule at lunch and 2 capsules at bedtime   glipiZIDE (GLUCOTROL) 10 MG tablet 1 TABLET TWICE A DAY FOR DIABETES ORALLY 90 DAYS   levothyroxine (SYNTHROID, LEVOTHROID) 125 MCG tablet Take 125 mcg by mouth daily before breakfast.    losartan (COZAAR) 25 MG tablet  TAKE 1 TABLET DAILY. PLEASE MAKE OVERDUE FOLLOW UP APPT FOR FURTHER REFILLS [phone]   memantine (NAMENDA) 10 MG tablet Take 10 mg by mouth 2 (two) times daily.   metFORMIN (GLUCOPHAGE-XR) 500 MG 24 hr tablet Take 500 mg by mouth 2 (two) times daily.   Misc Natural Products (OSTEO BI-FLEX JOINT SHIELD PO) Take 1 tablet by mouth daily.    Omega-3 Fatty Acids (FISH OIL) 1200 MG CAPS Take 1,200 mg by mouth daily.    Plant Sterols and Stanols 450 MG TABS Take 450 mg by mouth 2 (two) times daily.    rosuvastatin (CRESTOR) 10 MG tablet Take 10 mg by mouth daily.   spironolactone (ALDACTONE) 25 MG tablet TAKE 1 TABLET BY MOUTH EVERY DAY   No facility-administered encounter medications on file as of 12/17/2021.        No data to display            03/12/2021    4:00 PM  Montreal Cognitive Assessment   Visuospatial/ Executive (0/5) 4  Naming (0/3) 3  Attention: Read list of digits (0/2) 2  Attention: Read list of letters (0/1) 1  Attention: Serial 7 subtraction starting at 100 (0/3) 1  Language: Repeat phrase (0/2) 0  Language : Fluency (0/1) 0  Abstraction (0/2) 0  Delayed Recall (0/5) 1  Orientation (0/6) 5  Total 17  Adjusted Score (based on education) 18    Objective:     PHYSICAL EXAMINATION:    VITALS:   Vitals:   12/17/21 1116  BP: 129/64  Pulse: 74  Resp: 18  SpO2: 95%  Weight: 173 lb (78.5 kg)  Height: '5\' 2"'$  (1.575 m)    GEN:  The patient appears stated age and is in NAD. HEENT:  Normocephalic, atraumatic.   Neurological examination:  General: NAD, well-groomed, appears stated age. Orientation: The patient is alert. Oriented to person, place and date Cranial nerves: There is good facial symmetry.The speech is fluent and clear. No aphasia or dysarthria. Fund of knowledge is appropriate. Recent and remote memory are impaired. Attention and concentration are reduced.  Able to name objects and repeat phrases.  Hearing is intact to conversational tone.     Sensation: Sensation is intact to light touch throughout Motor: Strength is at least antigravity x4. Tremors: none  DTR's 2/4 in UE/LE     Movement examination: Tone: There is normal tone in the UE/LE Abnormal movements:  no tremor.  No myoclonus.  No asterixis.   Coordination:  There is no decremation with RAM's. Normal finger to nose  Gait and Station: The patient has no difficulty arising out of  a deep-seated chair without the use of the hands. [name] stride length is good.  Gait is cautious and narrow.    Thank you for allowing [place] the opportunity to participate in the care of this nice patient. Please do not hesitate to contact us for any questions or concerns.   Total time spent on today's visit was 20 minutes dedicated to this patient today, preparing to see patient, examining the patient, ordering tests and/or medications and counseling the patient, documenting clinical information in the EHR or other health record, independently interpreting results and communicating results to the patient/family, discussing treatment and goals, answering patient's questions and coordinating care.  Cc:  [name], MD  Sharene Butters 12/17/2021 11:24 AM

## 2021-12-17 NOTE — Patient Instructions (Addendum)
It was a pleasure to see you today at our office.   Recommendations:  Follow up in 6 month  Continued Donepezil 10 mg daily by PCP  Continue monitoring the blood pressure and other cardiovascular risk factors   It was a pleasure to see you today at our office.      Whom to call:  Memory  decline, memory medications: Call our office [phone]   For psychiatric meds, mood meds: Please have your primary care physician manage these medications.   Counseling regarding caregiver distress, including caregiver depression, anxiety and issues regarding community resources, adult day care programs, adult living facilities, or memory care questions:   Feel free to contact [place], Social Worker at [phone]   For assessment of decision of mental capacity and competency:  Call Dr. Anthoney Harada, geriatric psychiatrist at [phone]  For guidance in geriatric dementia issues please call [facility] [phone]  For guidance regarding [facility] and if placement were needed at the facility, contact Arnell Asal, Social Worker tel: [phone]  Consider [place]  [place], [place] [address] [phone]  Hours of Operation Mondays to Thursdays: 8 am to 8 pm,Fridays: 9 am to 8 pm, Saturdays: 9 am to 1 pm Sundays: Closed  [email or web address]/departments/parks-recreation/active-adults-[email or web address]/[facility]   If you have any severe symptoms of a stroke, or other severe issues such as confusion,severe chills or fever, etc call 911 or go to the ER as you may need to be evaluated further   Feel free to visit Facebook page " [employer]" for tips of how to care for people with memory problems.   Feel free to go to the following database for funded clinical studies conducted around the world: http://saunders.com/   https://www.triadclinicaltrials.com/     RECOMMENDATIONS FOR ALL PATIENTS WITH  MEMORY PROBLEMS: 1. Continue to exercise (Recommend 30 minutes of walking everyday, or 3 hours every week) 2. Increase social interactions - continue going to [place] and enjoy social gatherings with friends and family 3. Eat healthy, avoid fried foods and eat more fruits and vegetables 4. Maintain adequate blood pressure, blood sugar, and blood cholesterol level. Reducing the risk of stroke and cardiovascular disease also helps promoting better memory. 5. Avoid stressful situations. Live a simple life and avoid aggravations. Organize your time and prepare for the next day in anticipation. 6. Sleep well, avoid any interruptions of sleep and avoid any distractions in the bedroom that may interfere with adequate sleep quality 7. Avoid sugar, avoid sweets as there is a strong link between excessive sugar intake, diabetes, and cognitive impairment We discussed the Mediterranean diet, which has been shown to help patients reduce the risk of progressive memory disorders and reduces cardiovascular risk. This includes eating fish, eat fruits and green leafy vegetables, nuts like almonds and hazelnuts, walnuts, and also use olive oil. Avoid fast foods and fried foods as much as possible. Avoid sweets and sugar as sugar use has been linked to worsening of memory function.  There is always a concern of gradual progression of memory problems. If this is the case, then we may need to adjust level of care according to patient needs. Support, both to the patient and caregiver, should then be put into place.    The Alzheimer's Association is here all day, every day for people facing Alzheimer's disease through our free 24/7 Helpline: 401-145-0008. The Helpline provides reliable information and support to all those who need assistance, such as individuals living with memory  loss, Alzheimer's or other dementia, caregivers, health care professionals and the public.  Our highly trained and knowledgeable staff can help you  with: Understanding memory loss, dementia and Alzheimer's  Medications and other treatment options  General information about aging and brain health  Skills to provide quality care and to find the best care from professionals  Legal, financial and living-arrangement decisions Our Helpline also features: Confidential care consultation provided by master's level clinicians who can help with decision-making support, crisis assistance and education on issues families face every day  Help in a caller's preferred language using our translation service that features more than 200 languages and dialects  Referrals to local community programs, services and ongoing support     FALL PRECAUTIONS: Be cautious when walking. Scan the area for obstacles that may increase the risk of trips and falls. When getting up in the mornings, sit up at the edge of the bed for a few minutes before getting out of bed. Consider elevating the bed at the head end to avoid drop of blood pressure when getting up. Walk always in a well-lit room (use night lights in the walls). Avoid area rugs or power cords from appliances in the middle of the walkways. Use a walker or a cane if necessary and consider physical therapy for balance exercise. Get your eyesight checked regularly.  FINANCIAL OVERSIGHT: Supervision, especially oversight when making financial decisions or transactions is also recommended.  HOME SAFETY: Consider the safety of the kitchen when operating appliances like stoves, microwave oven, and blender. Consider having supervision and share cooking responsibilities until no longer able to participate in those. Accidents with firearms and other hazards in the house should be identified and addressed as well.   ABILITY TO BE LEFT ALONE: If patient is unable to contact 911 operator, consider using LifeLine, or when the need is there, arrange for someone to stay with patients. Smoking is a fire hazard, consider supervision  or cessation. Risk of wandering should be assessed by caregiver and if detected at any point, supervision and safe proof recommendations should be instituted.  MEDICATION SUPERVISION: Inability to self-administer medication needs to be constantly addressed. Implement a mechanism to ensure safe administration of the medications.   DRIVING: Regarding driving, in patients with progressive memory problems, driving will be impaired. We advise to have someone else do the driving if trouble finding directions or if minor accidents are reported. Independent driving assessment is available to determine safety of driving.   If you are interested in the driving assessment, you can contact the following:  [employer] in [place]  [place] [phone] or [phone]      [place] refers to food and lifestyle choices that are based on the traditions of countries located on the [employer]. This way of eating has been shown to help prevent certain conditions and improve outcomes for people who have chronic diseases, like kidney disease and heart disease. What are tips for following this plan? Lifestyle  Cook and eat meals together with your family, when possible. Drink enough fluid to keep your urine clear or pale yellow. Be physically active every day. This includes: Aerobic exercise like running or swimming. Leisure activities like gardening, walking, or housework. Get 7-8 hours of sleep each night. If recommended by your health care provider, drink red wine in moderation. This means 1 glass a day for nonpregnant women and 2 glasses  a day for men. A glass of wine equals 5 oz (150 mL). Reading food labels  Check the serving size of packaged foods. For foods such as rice and pasta, the serving size refers to the amount of cooked product, not  dry. Check the total fat in packaged foods. Avoid foods that have saturated fat or trans fats. Check the ingredients list for added sugars, such as corn syrup. Shopping  At the grocery store, buy most of your food from the areas near the walls of the store. This includes: Fresh fruits and vegetables (produce). Grains, beans, nuts, and seeds. Some of these may be available in unpackaged forms or large amounts (in bulk). Fresh seafood. Poultry and eggs. Low-fat dairy products. Buy whole ingredients instead of prepackaged foods. Buy fresh fruits and vegetables in-season from local farmers markets. Buy frozen fruits and vegetables in resealable bags. If you do not have access to quality fresh seafood, buy precooked frozen shrimp or canned fish, such as tuna, salmon, or sardines. Buy small amounts of raw or cooked vegetables, salads, or olives from the deli or salad bar at your store. Stock your pantry so you always have certain foods on hand, such as olive oil, canned tuna, canned tomatoes, rice, pasta, and beans. Cooking  Cook foods with extra-virgin olive oil instead of using butter or other vegetable oils. Have meat as a side dish, and have vegetables or grains as your main dish. This means having meat in small portions or adding small amounts of meat to foods like pasta or stew. Use beans or vegetables instead of meat in common dishes like chili or lasagna. Experiment with different cooking methods. Try roasting or broiling vegetables instead of steaming or sauteing them. Add frozen vegetables to soups, stews, pasta, or rice. Add nuts or seeds for added healthy fat at each meal. You can add these to yogurt, salads, or vegetable dishes. Marinate fish or vegetables using olive oil, lemon juice, garlic, and fresh herbs. Meal planning  Plan to eat 1 vegetarian meal one day each week. Try to work up to 2 vegetarian meals, if possible. Eat seafood 2 or more times a week. Have healthy snacks  readily available, such as: Vegetable sticks with hummus. Greek yogurt. Fruit and nut trail mix. Eat balanced meals throughout the week. This includes: Fruit: 2-3 servings a day Vegetables: 4-5 servings a day Low-fat dairy: 2 servings a day Fish, poultry, or lean meat: 1 serving a day Beans and legumes: 2 or more servings a week Nuts and seeds: 1-2 servings a day Whole grains: 6-8 servings a day Extra-virgin olive oil: 3-4 servings a day Limit red meat and sweets to only a few servings a month What are my food choices? Mediterranean diet Recommended Grains: Whole-grain pasta. Brown rice. Bulgar wheat. Polenta. Couscous. Whole-wheat bread. [place]. Vegetables: Artichokes. Beets. Broccoli. Cabbage. Carrots. Eggplant. Green beans. Chard. Kale. Spinach. Onions. Leeks. Peas. Squash. Tomatoes. Peppers. Radishes. Fruits: Apples. Apricots. Avocado. Berries. Bananas. Cherries. Dates. Figs. Grapes. Lemons. Melon. Oranges. Peaches. Plums. Pomegranate. Meats and other protein foods: Beans. Almonds. Sunflower seeds. Pine nuts. Peanuts. [place]. Salmon. Scallops. Shrimp. [place]. Tilapia. Clams. Oysters. Eggs. Dairy: Low-fat milk. Cheese. Greek yogurt. Beverages: Water. Red wine. Herbal tea. Fats and oils: Extra virgin olive oil. Avocado oil. Grape seed oil. Sweets and desserts: [place] yogurt with honey. Baked apples. Poached pears. Trail mix. Seasoning and other foods: Basil. Cilantro. Coriander. Cumin. Mint. Parsley. Sage. Rosemary. Tarragon. Garlic. Oregano. Thyme. Pepper. Balsalmic vinegar. Tahini. Hummus. Tomato sauce.  Olives. Mushrooms. Limit these Grains: Prepackaged pasta or rice dishes. Prepackaged cereal with added sugar. Vegetables: Deep fried potatoes (french fries). Fruits: Fruit canned in syrup. Meats and other protein foods: Beef. Pork. Lamb. Poultry with skin. Hot dogs. [place] Salines. Dairy: Ice cream. Sour cream. Whole milk. Beverages: Juice. Sugar-sweetened soft drinks. Beer. Liquor and  spirits. Fats and oils: Butter. Canola oil. Vegetable oil. Beef fat (tallow). Lard. Sweets and desserts: Cookies. Cakes. Pies. Candy. Seasoning and other foods: Mayonnaise. Premade sauces and marinades. The items listed may not be a complete list. Talk with your dietitian about what dietary choices are right for you. Summary The Mediterranean diet includes both food and lifestyle choices. Eat a variety of fresh fruits and vegetables, beans, nuts, seeds, and whole grains. Limit the amount of red meat and sweets that you eat. Talk with your health care provider about whether it is safe for you to drink red wine in moderation. This means 1 glass a day for nonpregnant women and 2 glasses a day for men. A glass of wine equals 5 oz (150 mL). This information is not intended to replace advice given to you by your health care provider. Make sure you discuss any questions you have with your health care provider. Document Released: 01/14/2016 Document Revised: 02/16/2016 Document Reviewed: 01/14/2016 Elsevier Interactive Patient Education  2017 [employer].

## 2022-01-03 DIAGNOSIS — Z9841 Cataract extraction status, right eye: Secondary | ICD-10-CM | POA: Diagnosis not present

## 2022-01-03 DIAGNOSIS — Z9842 Cataract extraction status, left eye: Secondary | ICD-10-CM | POA: Diagnosis not present

## 2022-01-03 DIAGNOSIS — H52213 Irregular astigmatism, bilateral: Secondary | ICD-10-CM | POA: Diagnosis not present

## 2022-01-03 DIAGNOSIS — E119 Type 2 diabetes mellitus without complications: Secondary | ICD-10-CM | POA: Diagnosis not present

## 2022-01-11 DIAGNOSIS — E1142 Type 2 diabetes mellitus with diabetic polyneuropathy: Secondary | ICD-10-CM | POA: Diagnosis not present

## 2022-01-11 DIAGNOSIS — I10 Essential (primary) hypertension: Secondary | ICD-10-CM | POA: Diagnosis not present

## 2022-01-11 DIAGNOSIS — E7800 Pure hypercholesterolemia, unspecified: Secondary | ICD-10-CM | POA: Diagnosis not present

## 2022-01-11 DIAGNOSIS — E039 Hypothyroidism, unspecified: Secondary | ICD-10-CM | POA: Diagnosis not present

## 2022-02-10 DIAGNOSIS — E7800 Pure hypercholesterolemia, unspecified: Secondary | ICD-10-CM | POA: Diagnosis not present

## 2022-02-10 DIAGNOSIS — E1142 Type 2 diabetes mellitus with diabetic polyneuropathy: Secondary | ICD-10-CM | POA: Diagnosis not present

## 2022-02-10 DIAGNOSIS — I10 Essential (primary) hypertension: Secondary | ICD-10-CM | POA: Diagnosis not present

## 2022-03-11 DIAGNOSIS — I10 Essential (primary) hypertension: Secondary | ICD-10-CM | POA: Diagnosis not present

## 2022-03-11 DIAGNOSIS — E7800 Pure hypercholesterolemia, unspecified: Secondary | ICD-10-CM | POA: Diagnosis not present

## 2022-03-11 DIAGNOSIS — E039 Hypothyroidism, unspecified: Secondary | ICD-10-CM | POA: Diagnosis not present

## 2022-03-11 DIAGNOSIS — E1142 Type 2 diabetes mellitus with diabetic polyneuropathy: Secondary | ICD-10-CM | POA: Diagnosis not present

## 2022-03-23 DIAGNOSIS — Z23 Encounter for immunization: Secondary | ICD-10-CM | POA: Diagnosis not present

## 2022-03-23 DIAGNOSIS — I5022 Chronic systolic (congestive) heart failure: Secondary | ICD-10-CM | POA: Diagnosis not present

## 2022-03-23 DIAGNOSIS — I10 Essential (primary) hypertension: Secondary | ICD-10-CM | POA: Diagnosis not present

## 2022-03-23 DIAGNOSIS — E7800 Pure hypercholesterolemia, unspecified: Secondary | ICD-10-CM | POA: Diagnosis not present

## 2022-03-23 DIAGNOSIS — R69 Illness, unspecified: Secondary | ICD-10-CM | POA: Diagnosis not present

## 2022-03-23 DIAGNOSIS — I429 Cardiomyopathy, unspecified: Secondary | ICD-10-CM | POA: Diagnosis not present

## 2022-03-23 DIAGNOSIS — E1142 Type 2 diabetes mellitus with diabetic polyneuropathy: Secondary | ICD-10-CM | POA: Diagnosis not present

## 2022-03-23 DIAGNOSIS — E039 Hypothyroidism, unspecified: Secondary | ICD-10-CM | POA: Diagnosis not present

## 2022-03-30 ENCOUNTER — Other Ambulatory Visit: Payer: Medicare HMO

## 2022-03-30 ENCOUNTER — Ambulatory Visit: Payer: Medicare HMO

## 2022-04-12 DIAGNOSIS — I10 Essential (primary) hypertension: Secondary | ICD-10-CM | POA: Diagnosis not present

## 2022-04-12 DIAGNOSIS — E039 Hypothyroidism, unspecified: Secondary | ICD-10-CM | POA: Diagnosis not present

## 2022-04-12 DIAGNOSIS — E7800 Pure hypercholesterolemia, unspecified: Secondary | ICD-10-CM | POA: Diagnosis not present

## 2022-04-12 DIAGNOSIS — E1142 Type 2 diabetes mellitus with diabetic polyneuropathy: Secondary | ICD-10-CM | POA: Diagnosis not present

## 2022-05-25 DIAGNOSIS — E1142 Type 2 diabetes mellitus with diabetic polyneuropathy: Secondary | ICD-10-CM | POA: Diagnosis not present

## 2022-05-25 DIAGNOSIS — E7800 Pure hypercholesterolemia, unspecified: Secondary | ICD-10-CM | POA: Diagnosis not present

## 2022-05-25 DIAGNOSIS — I10 Essential (primary) hypertension: Secondary | ICD-10-CM | POA: Diagnosis not present

## 2022-05-25 DIAGNOSIS — E039 Hypothyroidism, unspecified: Secondary | ICD-10-CM | POA: Diagnosis not present

## 2022-06-13 DIAGNOSIS — I10 Essential (primary) hypertension: Secondary | ICD-10-CM | POA: Diagnosis not present

## 2022-06-13 DIAGNOSIS — E039 Hypothyroidism, unspecified: Secondary | ICD-10-CM | POA: Diagnosis not present

## 2022-06-13 DIAGNOSIS — E1142 Type 2 diabetes mellitus with diabetic polyneuropathy: Secondary | ICD-10-CM | POA: Diagnosis not present

## 2022-06-13 DIAGNOSIS — E7800 Pure hypercholesterolemia, unspecified: Secondary | ICD-10-CM | POA: Diagnosis not present

## 2022-06-21 ENCOUNTER — Ambulatory Visit: Payer: Medicare HMO | Admitting: Physician Assistant

## 2022-06-21 ENCOUNTER — Encounter: Payer: Self-pay | Admitting: Physician Assistant

## 2022-06-21 VITALS — BP 153/67 | HR 103 | Resp 18 | Ht 62.0 in

## 2022-06-21 DIAGNOSIS — F03A0 Unspecified dementia, mild, without behavioral disturbance, psychotic disturbance, mood disturbance, and anxiety: Secondary | ICD-10-CM

## 2022-06-21 DIAGNOSIS — R4189 Other symptoms and signs involving cognitive functions and awareness: Secondary | ICD-10-CM | POA: Diagnosis not present

## 2022-06-21 DIAGNOSIS — R413 Other amnesia: Secondary | ICD-10-CM | POA: Diagnosis not present

## 2022-06-21 DIAGNOSIS — R69 Illness, unspecified: Secondary | ICD-10-CM | POA: Diagnosis not present

## 2022-06-21 MED ORDER — ESCITALOPRAM OXALATE 5 MG PO TABS: 5.0000 mg | ORAL_TABLET | Freq: Every day | ORAL | 3 refills | Status: DC

## 2022-06-21 NOTE — Progress Notes (Signed)
Assessment/Plan:   Dementia, unclear etiology, mild.  Brittney Malone is a very pleasant 82 y.o. RH female with a history of hypertension, hyperlipidemia, CAD, anxiety, depression and a history of mild vascular dementia after Neuropsych evaluation on 09/23/2021.  Seen today in follow up for memory loss. Patient is currently on memantine 10 mg twice daily, and donepezil 5 mg nightly (by PCP) although she is noncompliant with the medications. MRI brain personally reviewed was remarkable for mild generalized atrophy and mild chronic microvascular ischemic changes.  She has not been replenishing her B12, and her mood has been depressed according to her daughter-in-law.  They are interested in adult day programs to increase social stimulation on this patient.      Follow up in 6 months. Continue donepezil 10 mg daily, donepezil 5 mg daily side effects discussed. Recommend B12 replenishment Continue good control of cardiovascular risk factors Continue to control mood, will start Lexapro 5 mg nightly, may increase to 10 mg nightly if tolerated. Agree with adult day program, family to investigate, as this may help her socialization. Repeat Neuropsych evaluation towards the end of this year for clarity of the diagnosis and disease progression    Subjective:    This patient is accompanied in the office by her daughter-in-law who supplements the history.  Previous records as well as any outside records available were reviewed prior to todays visit. Patient was last seen on 04/19/2022, last MoCA was 19/30   Any changes in memory since last visit?  No significant changes. She gets out of the house more she does attend some family functions, but she is able to retain day bulk of the conversation, is the details that day are more difficult.  She is able to remember people's names.   repeats oneself?  Endorsed  especially with appointments. They drew the calendar and helps . Disoriented when walking  into a room?  Patient denies  Leaving objects in unusual places?  She continues to have some hoarding behavior, especially paper, receipts, coins.  present in prior visits.  Wandering behavior?   Denies  Any personality changes since last visit?  denies  " Generally happy, but not so when she does not want to leave the house" Any worsening depression?: No desire to do anything Hallucinations or paranoia?  denies   Seizures?    denies    Any sleep changes?  Denies vivid dreams, REM behavior or sleepwalking   Sleep apnea?   denies   Any hygiene concerns?   She continues to have less interest in bathing and dressing as prior. They took her to the salon the other day.  Independent of bathing and dressing?  Endorsed  Does the patient needs help with medications?  Daughter is in charge of the pillbox, ans hse does not like to take them every day  Who is in charge of the finances?   Son is in charge for the last year Any changes in appetite?  "Eats terrible ". Patient have trouble swallowing?  denies   Does the patient cook?  No, eat out, does not drink enough water Any headaches?   denies   Chronic back pain  denies   Ambulates with difficulty?  She has a history of osteoarthritis, which presents instability when walking,  Did home PT, bu she would not do on her own so she stopped.  She uses a cane  Recent falls or head injuries? 4 mo ago fell at the bottom f the steps outside,  no LOC Unilateral weakness, numbness or tingling?    denies   Any tremors?  denies   Any anosmia?  Patient denies   Any incontinence of urine?  Endorsed, does not like to wear diapers when she goes out. Any bowel dysfunction? Lose stools . Does not want to use disposable underwear  Patient lives   with her husband Does the patient drive?She no longer drives  Neuropsych evaluation 08/25/2021. Briefly, results suggested severe impairment surrounding encoding (i.e., learning) and delayed retrieval aspects of verbal and  visual memory. Further weaknesses were exhibited across complex attention, executive functioning, and verbal fluency. The etiology of ongoing dysfunction is unclear at the present time and further work-up and monitoring will be vital moving forward. There could also be a vascular contribution to ongoing dysfunction given her medical history (i.e., atrial tachycardia, systolic heart failure, cardiomyopathy, hypertension, hyperlipidemia, type II diabetes) and neuroimaging suggesting microvascular ischemic changes, it is my opinion that this somewhat minimizes the extent of changes seen on her scan. She does exhibit the common triad of symptoms consistent with normal pressure hydrocephalus (i.e., urinary incontinence, gait/balance disturbances, and ongoing cognitive decline). Her pattern of deficits across testing is not inconsistent with individuals who have this neurological condition. However, anatomically, per the radiologist who read her December 2022 brain MRI, no ventriculomegaly was described, which would be atypical of this condition. I cannot rule out a very early presentation of Alzheimer's disease. Across memory testing, Ms. Michaelson did not benefit from repeated exposure to information across learning trials and was fully amnestic across all memory tasks after a brief delay. However, she was able to demonstrate some memory storage across recognition trials which does not suggest a profound memory storage impairment. She also performed well across confrontation naming and visuospatial abilities. While weakness was noted across semantic fluency, it appeared mildly improved relative to phonemic fluency tasks. All of these are inconsistent with typically presenting Alzheimer's disease.      Initial evaluation 03/12/2021 the patient is seen in neurologic consultation at the request of [name], MD for the evaluation of memory.  The patient is accompanied by daughter in law  who supplements the  history. This is a 82 y.o. year old female who has had memory issues for about 3 years, when she began to ask the same questions, tell the same stories or same conversations, losing objects, placing them anywhere around the house.  Also, she has been noted to not complete her chores as before, such as not doing the laundry, not cooking, cleaning, and her hoarding has become more pronounced.  "She only remembers to volunteer at the church and at the thrift shop to every Tuesday and every Saturday once a month ".  She likes to watch TV.  She lives with her husband who is in good health.  Her mood is "very playful ".  She denies any depression or irritability, sleeps well without vivid dreams or sleepwalking, hallucinations or paranoia.  For the last 2 years, she has been showing less interest in bathing and dressing.  Her daughter-in-law manages the medications since August of this year, because she was not taking them, or putting them in somewhere where she cannot find them.  Her son manages the finances, because she was not keeping up with the bills.  Her appetite is good denies trouble swallowing.  She ambulates without the use of a walker or a cane, but she is more prone to falls since 2021, having bruised her tailbone, she refuses  to use the devices "at all".  She denies any head injuries.  Denies any headaches, double vision, dizziness, focal numbness or tingling, unilateral weakness or tremors.  She is urine incontinent, and she "leaves deposits everywhere ".  She does not like to wear diapers unless she goes out.  She has also chronic "stool accidents ".  Denies anosmia.  Denies a history of sleep apnea, alcohol or tobacco.  No family history of dementia.    PREVIOUS MEDICATIONS:   CURRENT MEDICATIONS:  Outpatient Encounter Medications as of 06/21/2022  Medication Sig   acetaminophen (TYLENOL) 500 MG tablet Take 1,000 mg by mouth every 6 (six) hours as needed for pain.   carvedilol (COREG) 12.5 MG  tablet TAKE 1 TABLET (12.5 MG TOTAL) BY MOUTH 2 (TWO) TIMES DAILY WITH A MEAL. NEED OFFICE VISIT   donepezil (ARICEPT) 5 MG tablet Take 5 mg by mouth at bedtime.   escitalopram (LEXAPRO) 5 MG tablet Take 1 tablet (5 mg total) by mouth at bedtime.   gabapentin (NEURONTIN) 300 MG capsule Take one capsule AM, one capsule at lunch and 2 capsules at bedtime   glipiZIDE (GLUCOTROL) 10 MG tablet 1 TABLET TWICE A DAY FOR DIABETES ORALLY 90 DAYS   levothyroxine (SYNTHROID, LEVOTHROID) 125 MCG tablet Take 125 mcg by mouth daily before breakfast.    losartan (COZAAR) 25 MG tablet TAKE 1 TABLET DAILY. PLEASE MAKE OVERDUE FOLLOW UP APPT FOR FURTHER REFILLS [phone]   memantine (NAMENDA) 10 MG tablet Take 10 mg by mouth 2 (two) times daily.   metFORMIN (GLUCOPHAGE-XR) 500 MG 24 hr tablet Take 500 mg by mouth 2 (two) times daily.   Misc Natural Products (OSTEO BI-FLEX JOINT SHIELD PO) Take 1 tablet by mouth daily.    Omega-3 Fatty Acids (FISH OIL) 1200 MG CAPS Take 1,200 mg by mouth daily.    Plant Sterols and Stanols 450 MG TABS Take 450 mg by mouth 2 (two) times daily.    rosuvastatin (CRESTOR) 10 MG tablet Take 10 mg by mouth daily.   spironolactone (ALDACTONE) 25 MG tablet TAKE 1 TABLET BY MOUTH EVERY DAY   No facility-administered encounter medications on file as of 06/21/2022.        No data to display            03/12/2021    4:00 PM  Montreal Cognitive Assessment   Visuospatial/ Executive (0/5) 4  Naming (0/3) 3  Attention: Read list of digits (0/2) 2  Attention: Read list of letters (0/1) 1  Attention: Serial 7 subtraction starting at 100 (0/3) 1  Language: Repeat phrase (0/2) 0  Language : Fluency (0/1) 0  Abstraction (0/2) 0  Delayed Recall (0/5) 1  Orientation (0/6) 5  Total 17  Adjusted Score (based on education) 18    Objective:     PHYSICAL EXAMINATION:    VITALS:   Vitals:   06/21/22 1124  BP: (!) 153/67  Pulse: (!) 103  Resp: 18  SpO2: 95%  Height: '5\' 2"'$   (1.575 m)    GEN:  The patient appears stated age and is in NAD. HEENT:  Normocephalic, atraumatic.   Neurological examination:  General: NAD, well-groomed, appears stated age. Orientation: The patient is alert. Oriented to person, place and date Cranial nerves: There is good facial symmetry.The speech is fluent and clear. No aphasia or dysarthria. Fund of knowledge is appropriate. Recent and remote memory are impaired. Attention and concentration are reduced.  Able to name objects and repeat phrases.  Hearing  is intact to conversational tone.    Sensation: Sensation is intact to light touch throughout Motor: Strength is at least antigravity x4. DTR's 2/4 in UE/LE     Movement examination: Tone: There is normal tone in the UE/LE Abnormal movements:  no tremor.  No myoclonus.  No asterixis.   Coordination:  There is no decremation with RAM's. Normal finger to nose  Gait and Station: The patient has some difficulty arising out of a deep-seated chair without the use of the hands. [name] stride length is good.  Gait is cautious and narrow.    Thank you for allowing [place] the opportunity to participate in the care of this nice patient. Please do not hesitate to contact us for any questions or concerns.   Total time spent on today's visit was 48 minutes dedicated to this patient today, preparing to see patient, examining the patient, ordering tests and/or medications and counseling the patient, documenting clinical information in the EHR or other health record, independently interpreting results and communicating results to the patient/family, discussing treatment and goals, answering patient's questions and coordinating care.  Cc:  [name], MD  Sharene Butters 06/21/2022 12:54 PM

## 2022-06-21 NOTE — Patient Instructions (Signed)
It was a pleasure to see you today at our office.   Recommendations:  Follow up in 6 month  Continued Donepezil  by PCP  Continue memantine 10 mg twice a day  Start Lexapro 5 mg at night for mood  Continue monitoring the blood pressure and other cardiovascular risk factors  Repeat neuropsych at the end of the year  It was a pleasure to see you today at our office.      Whom to call:  Memory  decline, memory medications: Call our office [phone]   For psychiatric meds, mood meds: Please have your primary care physician manage these medications.   Counseling regarding caregiver distress, including caregiver depression, anxiety and issues regarding community resources, adult day care programs, adult living facilities, or memory care questions:   Feel free to contact [place], Social Worker at [phone]   For assessment of decision of mental capacity and competency:  Call Dr. Anthoney Harada, geriatric psychiatrist at [phone]  For guidance in geriatric dementia issues please call [facility] [phone]  For guidance regarding [facility] and if placement were needed at the facility, contact Arnell Asal, Social Worker tel: [phone]  Consider [address], [place] [address] [phone]  Hours of Operation Mondays to Thursdays: 8 am to 8 pm,Fridays: 9 am to 8 pm, Saturdays: 9 am to 1 pm Sundays: Closed  [email or web address]/departments/parks-recreation/active-adults-[email or web address]/[email or web address]   If you have any severe symptoms of a stroke, or other severe issues such as confusion,severe chills or fever, etc call 911 or go to the ER as you may need to be evaluated further   Feel free to visit Facebook page " [email or web address]" for tips of how to care for people with memory problems.      RECOMMENDATIONS FOR ALL PATIENTS WITH MEMORY PROBLEMS: 1. Continue to exercise (Recommend 30  minutes of walking everyday, or 3 hours every week) 2. Increase social interactions - continue going to [place] and enjoy social gatherings with friends and family 3. Eat healthy, avoid fried foods and eat more fruits and vegetables 4. Maintain adequate blood pressure, blood sugar, and blood cholesterol level. Reducing the risk of stroke and cardiovascular disease also helps promoting better memory. 5. Avoid stressful situations. Live a simple life and avoid aggravations. Organize your time and prepare for the next day in anticipation. 6. Sleep well, avoid any interruptions of sleep and avoid any distractions in the bedroom that may interfere with adequate sleep quality 7. Avoid sugar, avoid sweets as there is a strong link between excessive sugar intake, diabetes, and cognitive impairment We discussed the Mediterranean diet, which has been shown to help patients reduce the risk of progressive memory disorders and reduces cardiovascular risk. This includes eating fish, eat fruits and green leafy vegetables, nuts like almonds and hazelnuts, walnuts, and also use olive oil. Avoid fast foods and fried foods as much as possible. Avoid sweets and sugar as sugar use has been linked to worsening of memory function.  There is always a concern of gradual progression of memory problems. If this is the case, then we may need to adjust level of care according to patient needs. Support, both to the patient and caregiver, should then be put into place.    The Alzheimer's Association is here all day, every day for people facing Alzheimer's disease through our free 24/7 Helpline: [phone]. The Helpline provides reliable information and support to all those who need assistance, such as individuals living  with memory loss, Alzheimer's or other dementia, caregivers, health care professionals and the public.  Our highly trained and knowledgeable staff can help you with: Understanding memory loss, dementia and  Alzheimer's  Medications and other treatment options  General information about aging and brain health  Skills to provide quality care and to find the best care from professionals  Legal, financial and living-arrangement decisions Our Helpline also features: Confidential care consultation provided by master's level clinicians who can help with decision-making support, crisis assistance and education on issues families face every day  Help in a caller's preferred language using our translation service that features more than 200 languages and dialects  Referrals to local community programs, services and ongoing support     FALL PRECAUTIONS: Be cautious when walking. Scan the area for obstacles that may increase the risk of trips and falls. When getting up in the mornings, sit up at the edge of the bed for a few minutes before getting out of bed. Consider elevating the bed at the head end to avoid drop of blood pressure when getting up. Walk always in a well-lit room (use night lights in the walls). Avoid area rugs or power cords from appliances in the middle of the walkways. Use a walker or a cane if necessary and consider physical therapy for balance exercise. Get your eyesight checked regularly.  FINANCIAL OVERSIGHT: Supervision, especially oversight when making financial decisions or transactions is also recommended.  HOME SAFETY: Consider the safety of the kitchen when operating appliances like stoves, microwave oven, and blender. Consider having supervision and share cooking responsibilities until no longer able to participate in those. Accidents with firearms and other hazards in the house should be identified and addressed as well.   ABILITY TO BE LEFT ALONE: If patient is unable to contact 911 operator, consider using LifeLine, or when the need is there, arrange for someone to stay with patients. Smoking is a fire hazard, consider supervision or cessation. Risk of wandering should be  assessed by caregiver and if detected at any point, supervision and safe proof recommendations should be instituted.  MEDICATION SUPERVISION: Inability to self-administer medication needs to be constantly addressed. Implement a mechanism to ensure safe administration of the medications.   DRIVING: Regarding driving, in patients with progressive memory problems, driving will be impaired. We advise to have someone else do the driving if trouble finding directions or if minor accidents are reported. Independent driving assessment is available to determine safety of driving.   If you are interested in the driving assessment, you can contact the following:  [employer] in [place]  [place] [phone] or [phone]      [place] refers to food and lifestyle choices that are based on the traditions of countries located on the [employer]. This way of eating has been shown to help prevent certain conditions and improve outcomes for people who have chronic diseases, like kidney disease and heart disease. What are tips for following this plan? Lifestyle  Cook and eat meals together with your family, when possible. Drink enough fluid to keep your urine clear or pale yellow. Be physically active every day. This includes: Aerobic exercise like running or swimming. Leisure activities like gardening, walking, or housework. Get 7-8 hours of sleep each night. If recommended by your health care provider, drink red wine in moderation. This means 1 glass a day for nonpregnant women and  2 glasses a day for men. A glass of wine equals 5 oz (150 mL). Reading food labels  Check the serving size of packaged foods. For foods such as rice and pasta, the serving size refers to the amount of cooked product, not dry. Check the total fat in packaged foods.  Avoid foods that have saturated fat or trans fats. Check the ingredients list for added sugars, such as corn syrup. Shopping  At the grocery store, buy most of your food from the areas near the walls of the store. This includes: Fresh fruits and vegetables (produce). Grains, beans, nuts, and seeds. Some of these may be available in unpackaged forms or large amounts (in bulk). Fresh seafood. Poultry and eggs. Low-fat dairy products. Buy whole ingredients instead of prepackaged foods. Buy fresh fruits and vegetables in-season from local farmers markets. Buy frozen fruits and vegetables in resealable bags. If you do not have access to quality fresh seafood, buy precooked frozen shrimp or canned fish, such as tuna, salmon, or sardines. Buy small amounts of raw or cooked vegetables, salads, or olives from the deli or salad bar at your store. Stock your pantry so you always have certain foods on hand, such as olive oil, canned tuna, canned tomatoes, rice, pasta, and beans. Cooking  Cook foods with extra-virgin olive oil instead of using butter or other vegetable oils. Have meat as a side dish, and have vegetables or grains as your main dish. This means having meat in small portions or adding small amounts of meat to foods like pasta or stew. Use beans or vegetables instead of meat in common dishes like chili or lasagna. Experiment with different cooking methods. Try roasting or broiling vegetables instead of steaming or sauteing them. Add frozen vegetables to soups, stews, pasta, or rice. Add nuts or seeds for added healthy fat at each meal. You can add these to yogurt, salads, or vegetable dishes. Marinate fish or vegetables using olive oil, lemon juice, garlic, and fresh herbs. Meal planning  Plan to eat 1 vegetarian meal one day each week. Try to work up to 2 vegetarian meals, if possible. Eat seafood 2 or more times a week. Have healthy snacks readily available, such as: Vegetable sticks  with hummus. Greek yogurt. Fruit and nut trail mix. Eat balanced meals throughout the week. This includes: Fruit: 2-3 servings a day Vegetables: 4-5 servings a day Low-fat dairy: 2 servings a day Fish, poultry, or lean meat: 1 serving a day Beans and legumes: 2 or more servings a week Nuts and seeds: 1-2 servings a day Whole grains: 6-8 servings a day Extra-virgin olive oil: 3-4 servings a day Limit red meat and sweets to only a few servings a month What are my food choices? Mediterranean diet Recommended Grains: Whole-grain pasta. Brown rice. Bulgar wheat. Polenta. Couscous. Whole-wheat bread. [place]. Vegetables: Artichokes. Beets. Broccoli. Cabbage. Carrots. Eggplant. Green beans. Chard. Kale. Spinach. Onions. Leeks. Peas. Squash. Tomatoes. Peppers. Radishes. Fruits: Apples. Apricots. Avocado. Berries. Bananas. Cherries. Dates. Figs. Grapes. Lemons. Melon. Oranges. Peaches. Plums. Pomegranate. Meats and other protein foods: Beans. Almonds. Sunflower seeds. Pine nuts. Peanuts. [place]. Salmon. Scallops. Shrimp. [place]. Tilapia. Clams. Oysters. Eggs. Dairy: Low-fat milk. Cheese. Greek yogurt. Beverages: Water. Red wine. Herbal tea. Fats and oils: Extra virgin olive oil. Avocado oil. Grape seed oil. Sweets and desserts: [place] yogurt with honey. Baked apples. Poached pears. Trail mix. Seasoning and other foods: Basil. Cilantro. Coriander. Cumin. Mint. Parsley. Sage. Rosemary. Tarragon. Garlic. Oregano. Thyme. Pepper. Balsalmic vinegar. Tahini. Hummus.  Tomato sauce. Olives. Mushrooms. Limit these Grains: Prepackaged pasta or rice dishes. Prepackaged cereal with added sugar. Vegetables: Deep fried potatoes (french fries). Fruits: Fruit canned in syrup. Meats and other protein foods: Beef. Pork. Lamb. Poultry with skin. Hot dogs. [place] Salines. Dairy: Ice cream. Sour cream. Whole milk. Beverages: Juice. Sugar-sweetened soft drinks. Beer. Liquor and spirits. Fats and oils: Butter. Canola oil.  Vegetable oil. Beef fat (tallow). Lard. Sweets and desserts: Cookies. Cakes. Pies. Candy. Seasoning and other foods: Mayonnaise. Premade sauces and marinades. The items listed may not be a complete list. Talk with your dietitian about what dietary choices are right for you. Summary The Mediterranean diet includes both food and lifestyle choices. Eat a variety of fresh fruits and vegetables, beans, nuts, seeds, and whole grains. Limit the amount of red meat and sweets that you eat. Talk with your health care provider about whether it is safe for you to drink red wine in moderation. This means 1 glass a day for nonpregnant women and 2 glasses a day for men. A glass of wine equals 5 oz (150 mL). This information is not intended to replace advice given to you by your health care provider. Make sure you discuss any questions you have with your health care provider. Document Released: 01/14/2016 Document Revised: 02/16/2016 Document Reviewed: 01/14/2016 Elsevier Interactive Patient Education  2017 [employer].

## 2022-07-14 ENCOUNTER — Other Ambulatory Visit: Payer: Self-pay | Admitting: Physician Assistant

## 2022-09-07 DIAGNOSIS — E7800 Pure hypercholesterolemia, unspecified: Secondary | ICD-10-CM | POA: Diagnosis not present

## 2022-09-07 DIAGNOSIS — E1142 Type 2 diabetes mellitus with diabetic polyneuropathy: Secondary | ICD-10-CM | POA: Diagnosis not present

## 2022-09-07 DIAGNOSIS — E039 Hypothyroidism, unspecified: Secondary | ICD-10-CM | POA: Diagnosis not present

## 2022-09-07 DIAGNOSIS — I10 Essential (primary) hypertension: Secondary | ICD-10-CM | POA: Diagnosis not present

## 2022-09-13 IMAGING — MR MR HEAD W/O CM
11 series · 48 of 48 positions shown · non-contrast
Comparison: None.

CLINICAL DATA: Provided history: Memory loss. Additional history
provided: Difficulty walking, numbness in bilateral hands/feet,
weakness in bilateral hands.

EXAM:
MRI HEAD WITHOUT CONTRAST
TECHNIQUE: Multiplanar, multiecho pulse sequences of the brain and surrounding
structures were obtained without intravenous contrast.

[Series 3: DWI · axial · 3.0mm · 1.80mm/px · z∈[-54,+89]mm · 9 of 100 slices shown (1 of 4)]
[im 1/100]
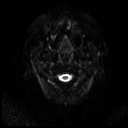
[im 13/100]
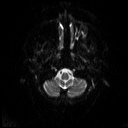
[im 25/100]
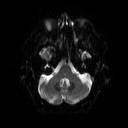
[im 38/100]
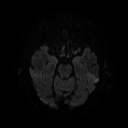
[im 50/100]
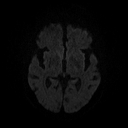
[im 62/100]
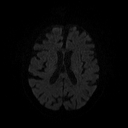
[im 75/100]
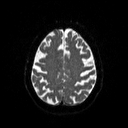
[im 87/100]
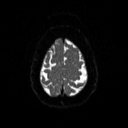
[im 100/100]
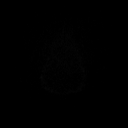

[Series 4: DWI · axial · 3.0mm · 1.80mm/px · z∈[-54,+89]mm · 4 of 50 slices shown (2 of 4)]
[im 1/50]
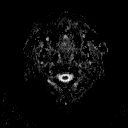
[im 17/50]
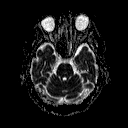
[im 33/50]
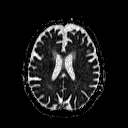
[im 50/50]
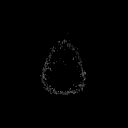

[Series 5: T1 · sagittal · 5.0mm · 0.47mm/px · 2 of 22 slices shown]
[im 1/22]
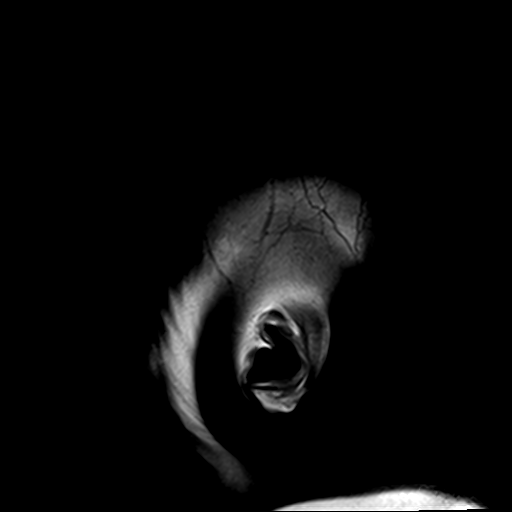
[im 22/22]
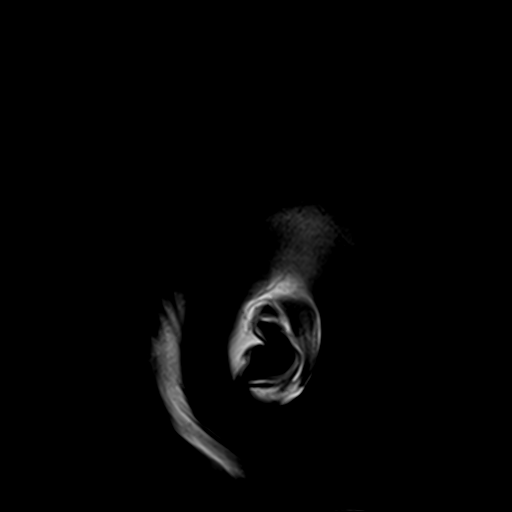

[Series 6: DWI · coronal · 5.0mm · 1.80mm/px · 6 of 70 slices shown (3 of 4)]
[im 1/70]
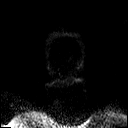
[im 14/70]
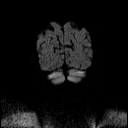
[im 28/70]
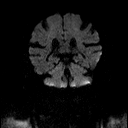
[im 42/70]
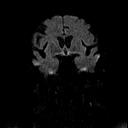
[im 56/70]
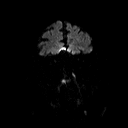
[im 70/70]
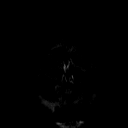

[Series 7: DWI · coronal · 5.0mm · 1.80mm/px · 3 of 35 slices shown (4 of 4)]
[im 1/35]
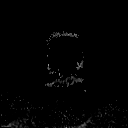
[im 18/35]
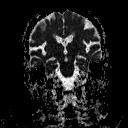
[im 35/35]
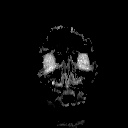

[Series 8: T2 · axial · 5.0mm · 0.72mm/px · z∈[-46,+90]mm · 2 of 21 slices shown (1 of 2)]
[im 1/21]
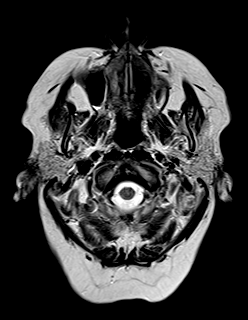
[im 21/21]
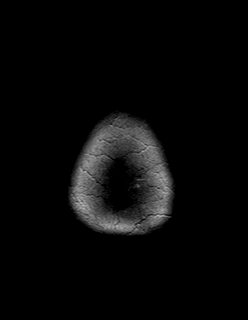

[Series 9: FLAIR · axial · 3.0mm · 0.45mm/px · z∈[-49,+91]mm · 3 of 32 slices shown]
[im 1/32]
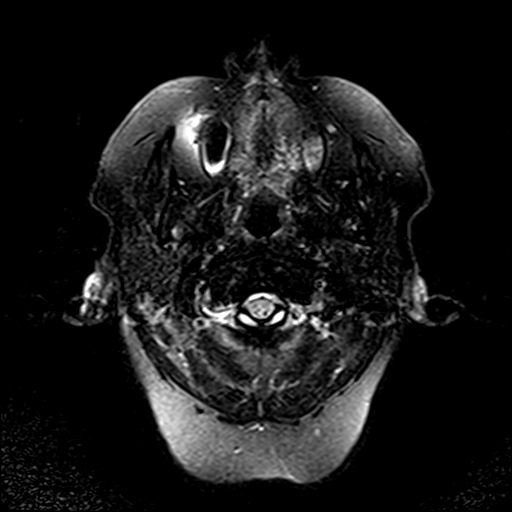
[im 16/32]
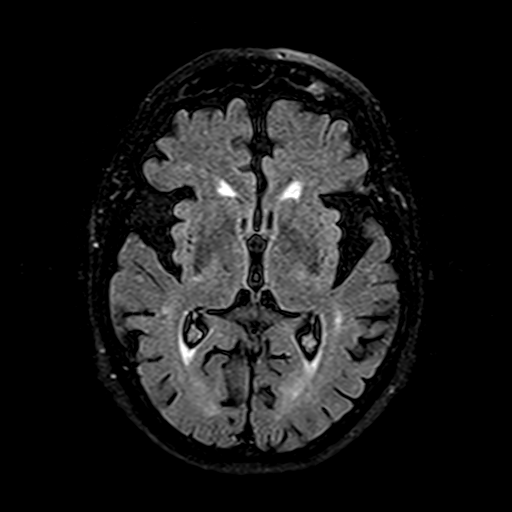
[im 32/32]
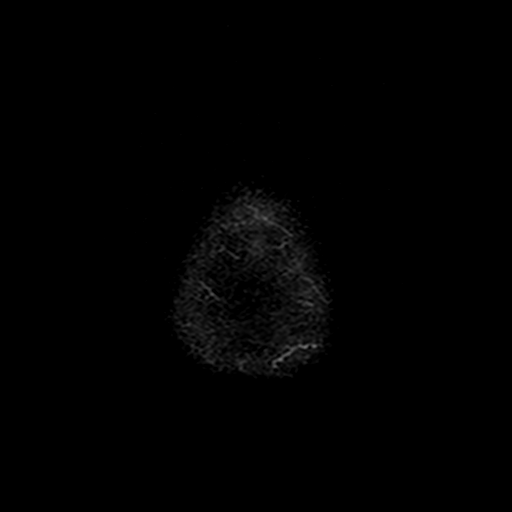

[Series 10: mip_images(sw) · axial · 32.0mm · 0.90mm/px · z∈[-33,+76]mm · 2 of 29 slices shown]
[im 1/29]
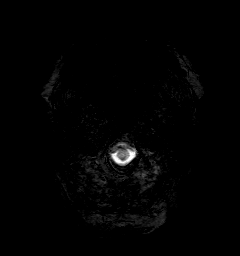
[im 29/29]
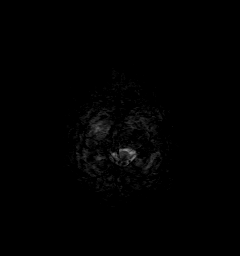

[Series 11: swi_images · axial · 4.0mm · 0.90mm/px · z∈[-47,+89]mm · 3 of 36 slices shown]
[im 1/36]
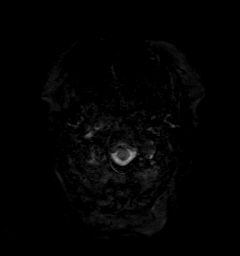
[im 18/36]
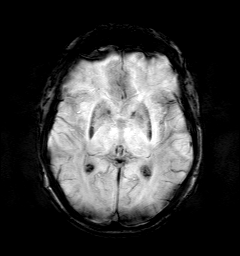
[im 36/36]
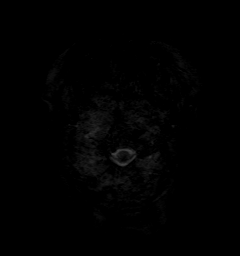

[Series 12: t1_mpr_tra · axial · 1.0mm · 0.71mm/px · z∈[-47,+92]mm · 12 of 144 slices shown]
[im 1/144]
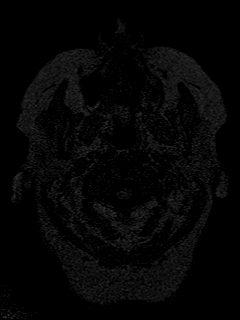
[im 14/144]
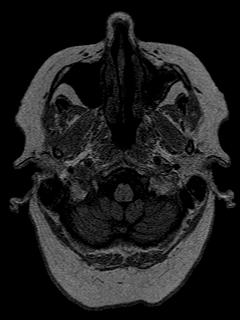
[im 27/144]
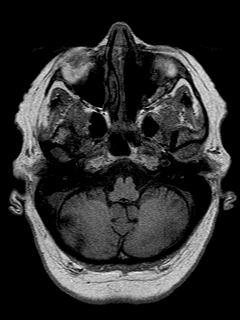
[im 40/144]
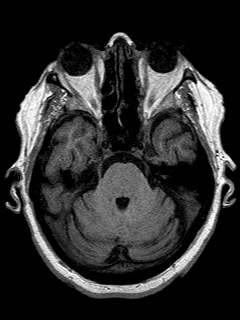
[im 53/144]
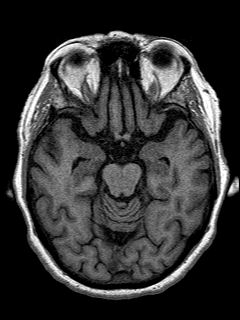
[im 66/144]
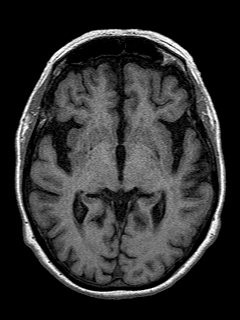
[im 79/144]
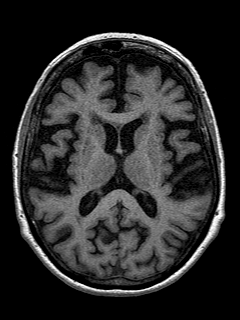
[im 92/144]
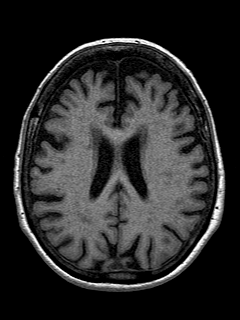
[im 105/144]
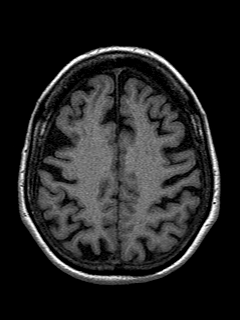
[im 118/144]
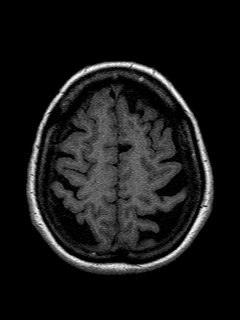
[im 131/144]
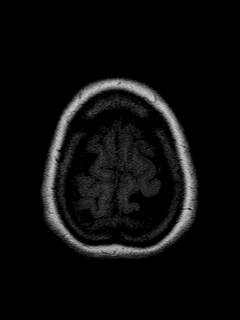
[im 144/144]
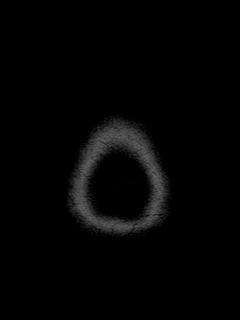

[Series 13: T2 · coronal · 5.0mm · 0.45mm/px · 2 of 28 slices shown (2 of 2)]
[im 1/28]
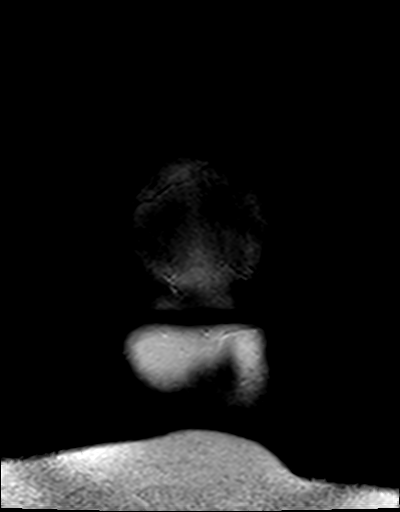
[im 28/28]
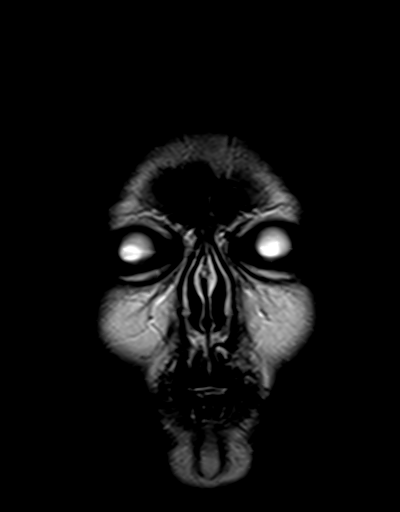

[48 of 48 positions shown; findings below may reference images not displayed]

FINDINGS: Brain:

Mild generalized cerebral and cerebellar atrophy.

Mild multifocal T2 FLAIR hyperintense signal abnormality within the
cerebral white matter and pons, nonspecific but compatible with
chronic small vessel ischemic disease.

There is no acute infarct.

No evidence of an intracranial mass.

No chronic intracranial blood products.

No extra-axial fluid collection.

No midline shift.

Vascular: Maintained flow voids within the proximal large arterial
vessels.

Skull and upper cervical spine: No focal suspicious marrow lesion.

Sinuses/Orbits: Visualized orbits show no acute finding.
Postsurgical appearance of the paranasal sinuses. Mild scattered
paranasal sinus mucosal thickening, greatest within the left
maxillary sinus.

Other: Trace fluid within the bilateral mastoid air cells.
IMPRESSION: No evidence of acute intracranial abnormality.

Mild generalized cerebral atrophy.

Mild chronic small vessel ischemic changes within the cerebral white
matter and pons.

Mild paranasal sinus mucosal thickening, most notably left
maxillary.

Trace fluid within the bilateral mastoid air cells.

## 2022-10-06 DIAGNOSIS — I5022 Chronic systolic (congestive) heart failure: Secondary | ICD-10-CM | POA: Diagnosis not present

## 2022-10-06 DIAGNOSIS — I10 Essential (primary) hypertension: Secondary | ICD-10-CM | POA: Diagnosis not present

## 2022-10-06 DIAGNOSIS — E7800 Pure hypercholesterolemia, unspecified: Secondary | ICD-10-CM | POA: Diagnosis not present

## 2022-10-06 DIAGNOSIS — E039 Hypothyroidism, unspecified: Secondary | ICD-10-CM | POA: Diagnosis not present

## 2022-10-06 DIAGNOSIS — E1165 Type 2 diabetes mellitus with hyperglycemia: Secondary | ICD-10-CM | POA: Diagnosis not present

## 2022-10-12 DIAGNOSIS — I5022 Chronic systolic (congestive) heart failure: Secondary | ICD-10-CM | POA: Diagnosis not present

## 2022-10-12 DIAGNOSIS — E039 Hypothyroidism, unspecified: Secondary | ICD-10-CM | POA: Diagnosis not present

## 2022-10-12 DIAGNOSIS — Z0000 Encounter for general adult medical examination without abnormal findings: Secondary | ICD-10-CM | POA: Diagnosis not present

## 2022-10-12 DIAGNOSIS — I110 Hypertensive heart disease with heart failure: Secondary | ICD-10-CM | POA: Diagnosis not present

## 2022-10-12 DIAGNOSIS — F01B0 Vascular dementia, moderate, without behavioral disturbance, psychotic disturbance, mood disturbance, and anxiety: Secondary | ICD-10-CM | POA: Diagnosis not present

## 2022-10-12 DIAGNOSIS — E1142 Type 2 diabetes mellitus with diabetic polyneuropathy: Secondary | ICD-10-CM | POA: Diagnosis not present

## 2022-10-12 DIAGNOSIS — I10 Essential (primary) hypertension: Secondary | ICD-10-CM | POA: Diagnosis not present

## 2022-10-12 DIAGNOSIS — E1165 Type 2 diabetes mellitus with hyperglycemia: Secondary | ICD-10-CM | POA: Diagnosis not present

## 2022-10-12 DIAGNOSIS — I429 Cardiomyopathy, unspecified: Secondary | ICD-10-CM | POA: Diagnosis not present

## 2022-10-12 DIAGNOSIS — I471 Supraventricular tachycardia, unspecified: Secondary | ICD-10-CM | POA: Diagnosis not present

## 2022-10-12 DIAGNOSIS — E7800 Pure hypercholesterolemia, unspecified: Secondary | ICD-10-CM | POA: Diagnosis not present

## 2022-11-07 DIAGNOSIS — I10 Essential (primary) hypertension: Secondary | ICD-10-CM | POA: Diagnosis not present

## 2022-11-07 DIAGNOSIS — E1142 Type 2 diabetes mellitus with diabetic polyneuropathy: Secondary | ICD-10-CM | POA: Diagnosis not present

## 2022-11-07 DIAGNOSIS — E7800 Pure hypercholesterolemia, unspecified: Secondary | ICD-10-CM | POA: Diagnosis not present

## 2022-11-07 DIAGNOSIS — E039 Hypothyroidism, unspecified: Secondary | ICD-10-CM | POA: Diagnosis not present

## 2022-12-05 DIAGNOSIS — E7800 Pure hypercholesterolemia, unspecified: Secondary | ICD-10-CM | POA: Diagnosis not present

## 2022-12-05 DIAGNOSIS — I5022 Chronic systolic (congestive) heart failure: Secondary | ICD-10-CM | POA: Diagnosis not present

## 2022-12-05 DIAGNOSIS — I10 Essential (primary) hypertension: Secondary | ICD-10-CM | POA: Diagnosis not present

## 2022-12-05 DIAGNOSIS — E039 Hypothyroidism, unspecified: Secondary | ICD-10-CM | POA: Diagnosis not present

## 2022-12-05 DIAGNOSIS — E1165 Type 2 diabetes mellitus with hyperglycemia: Secondary | ICD-10-CM | POA: Diagnosis not present

## 2022-12-20 ENCOUNTER — Ambulatory Visit: Payer: Medicare HMO | Admitting: Physician Assistant

## 2023-02-27 DIAGNOSIS — H52213 Irregular astigmatism, bilateral: Secondary | ICD-10-CM | POA: Diagnosis not present

## 2023-02-27 DIAGNOSIS — Z9842 Cataract extraction status, left eye: Secondary | ICD-10-CM | POA: Diagnosis not present

## 2023-02-27 DIAGNOSIS — Z9841 Cataract extraction status, right eye: Secondary | ICD-10-CM | POA: Diagnosis not present

## 2023-02-27 DIAGNOSIS — E113293 Type 2 diabetes mellitus with mild nonproliferative diabetic retinopathy without macular edema, bilateral: Secondary | ICD-10-CM | POA: Diagnosis not present

## 2023-03-15 ENCOUNTER — Ambulatory Visit: Payer: Medicare HMO | Attending: Cardiology | Admitting: Cardiology

## 2023-03-15 ENCOUNTER — Encounter: Payer: Self-pay | Admitting: Cardiology

## 2023-03-15 VITALS — BP 132/80 | HR 52 | Wt 172.0 lb

## 2023-03-15 DIAGNOSIS — I5022 Chronic systolic (congestive) heart failure: Secondary | ICD-10-CM

## 2023-03-15 DIAGNOSIS — I4719 Other supraventricular tachycardia: Secondary | ICD-10-CM | POA: Diagnosis not present

## 2023-03-15 DIAGNOSIS — E785 Hyperlipidemia, unspecified: Secondary | ICD-10-CM | POA: Diagnosis not present

## 2023-03-15 NOTE — Progress Notes (Signed)
Cardiology Office Note:  .   Date:  03/15/2023  ID:  Brittney Malone, DOB 02/22/1941, MRN [id number] PCP: Brittney Retort, MD  [facility] Providers Cardiologist:  None     History of Present Illness: .   Brittney Malone is a 82 y.o. female Discussed with the use of AI scribe   History of Present Illness   The patient, an 82 year old with a history of non-ischemic dilated cardiomyopathy, chronic systolic heart failure, and paroxysmal atrial tachycardia, presented for a follow-up visit. The cardiomyopathy, possibly of viral etiology, was diagnosed in 2014 with an EF of 30% and no significant CAD on heart catheterization. The patient had difficulty tolerating carvedilol 25mg  twice daily. A trial of Sherryll Burger was attempted but discontinued due to cost. An echocardiogram in 2016 showed normalization of pump function with an EF of 55%. The patient also has a history of mild aortic valve regurgitation.  [name] son also has cardiomyopathy, suggesting a possible familial link.  [name] medication regimen includes losartan 25mg , Namenda, rosuvastatin 10mg , spironolactone 25mg , and carvedilol 12.5mg  twice daily. However, the patient's adherence to this regimen is inconsistent, with the patient admitting to only taking the medications approximately four times in a two-week span. Daughter helped with history.   The patient denies any current symptoms of heart failure, such as shortness of breath or chest pain. [name] daughter, who accompanied the patient to the clinic, confirmed the patient's self-reported health status. The patient has a handicap placard due to mobility issues. An echocardiogram in 2022 showed mild to moderate aortic regurgitation, which was stable compared to previous findings. [name] pump function appears to be around 50%. The patient has lost some weight since the last visit.          ROS: No CP, no SOB  Studies Reviewed: Marland Kitchen   EKG  Interpretation Date/Time:  Wednesday March 15 2023 10:44:11 EDT Ventricular Rate:  97 PR Interval:  156 QRS Duration:  82 QT Interval:  364 QTC Calculation: 462 R Axis:   215  Text Interpretation: Sinus rhythm with frequent Premature ventricular complexes and Premature atrial complexes Right superior axis deviation Low voltage QRS Septal infarct (cited on or before 02-Mar-2013) When compared with ECG of 03-Mar-2013 03:33, Premature ventricular complexes are noted Confirmed by Donato Schultz ([id number]) on 03/15/2023 10:55:22 AM     Risk Assessment/Calculations:            Physical Exam:   VS:  BP 132/80 (BP Location: Right Arm, Patient Position: Sitting, Cuff Size: Normal)   Pulse (!) 52   Wt 172 lb (78 kg)   LMP  (LMP Unknown)   SpO2 95%   BMI 31.46 kg/m    Wt Readings from Last 3 Encounters:  03/15/23 172 lb (78 kg)  12/17/21 173 lb (78.5 kg)  03/12/21 191 lb (86.6 kg)    GEN: Well nourished, well developed in no acute distress NECK: No JVD; No carotid bruits CARDIAC: Occasional ectopy RRR, 2/6 diastolic murmur, no rubs, no gallops RESPIRATORY:  Clear to auscultation without rales, wheezing or rhonchi  ABDOMEN: Soft, non-tender, non-distended EXTREMITIES:  No edema; No deformity   ASSESSMENT AND PLAN: .    Assessment and Plan    Dilated Cardiomyopathy Non-ischemic, possibly viral etiology. EF normalized to 55% in 2016. Currently asymptomatic. Inconsistent medication adherence. -Encouraged consistent use of Carvedilol 12.5mg  BID, Losartan 25mg  daily, and Spironolactone 25mg  daily to maintain cardiac function.  Aortic Regurgitation Mild to moderate, stable on recent  echocardiogram. No symptoms reported. -No changes to current management.  Paroxysmal Atrial Tachycardia/PVC Improved, no current symptoms. -No changes to current management. Encourage Coreg.  General Health Maintenance -Complete handicap placard paperwork. -Follow-up in 1 year.               Signed, Donato Schultz, MD

## 2023-03-15 NOTE — Patient Instructions (Signed)
Medication Instructions:   *If you need a refill on your cardiac medications before your next appointment, please call your pharmacy*   Lab Work:  If you have labs (blood work) drawn today and your tests are completely normal, you will receive your results only by: MyChart Message (if you have MyChart) OR A paper copy in the mail If you have any lab test that is abnormal or we need to change your treatment, we will call you to review the results.   Testing/Procedures:    Follow-Up: At [facility], you and your health needs are our priority.  As part of our continuing mission to provide you with exceptional heart care, we have created designated Provider Care Teams.  These Care Teams include your primary Cardiologist (physician) and Advanced Practice Providers (APPs -  Physician Assistants and Nurse Practitioners) who all work together to provide you with the care you need, when you need it.  We recommend signing up for the patient portal called "MyChart".  Sign up information is provided on this After Visit Summary.  MyChart is used to connect with patients for Virtual Visits (Telemedicine).  Patients are able to view lab/test results, encounter notes, upcoming appointments, etc.  Non-urgent messages can be sent to your provider as well.   To learn more about what you can do with MyChart, go to [email or web address].    Your next appointment:   1 year(s)  Provider:   Dr Donato Schultz    Other Instructions

## 2023-04-12 DIAGNOSIS — I10 Essential (primary) hypertension: Secondary | ICD-10-CM | POA: Diagnosis not present

## 2023-04-27 ENCOUNTER — Institutional Professional Consult (permissible substitution): Payer: Medicare HMO | Admitting: Psychology

## 2023-04-27 ENCOUNTER — Ambulatory Visit: Payer: Self-pay

## 2023-05-08 ENCOUNTER — Encounter: Payer: Medicare HMO | Admitting: Psychology

## 2023-05-23 ENCOUNTER — Other Ambulatory Visit: Payer: Self-pay | Admitting: Physician Assistant

## 2023-08-21 DIAGNOSIS — E1165 Type 2 diabetes mellitus with hyperglycemia: Secondary | ICD-10-CM | POA: Diagnosis not present

## 2023-08-21 DIAGNOSIS — I10 Essential (primary) hypertension: Secondary | ICD-10-CM | POA: Diagnosis not present

## 2023-08-21 DIAGNOSIS — J40 Bronchitis, not specified as acute or chronic: Secondary | ICD-10-CM | POA: Diagnosis not present

## 2023-11-01 DIAGNOSIS — Z0000 Encounter for general adult medical examination without abnormal findings: Secondary | ICD-10-CM | POA: Diagnosis not present

## 2024-01-24 DIAGNOSIS — I469 Cardiac arrest, cause unspecified: Secondary | ICD-10-CM | POA: Diagnosis not present

## 2024-02-05 DIAGNOSIS — 419620001 Death: Secondary | SNOMED CT | POA: Diagnosis not present

## 2024-02-05 DEATH — deceased
